# Patient Record
Sex: Male | Born: 1960 | Race: White | Hispanic: No | Smoking: Former smoker
Health system: Southern US, Community
[De-identification: ages and names within clinical notes are randomized; demographics above are authoritative.]

## PROBLEM LIST (undated history)

## (undated) DIAGNOSIS — F419 Anxiety disorder, unspecified: Secondary | ICD-10-CM

## (undated) DIAGNOSIS — F32A Depression, unspecified: Secondary | ICD-10-CM

## (undated) DIAGNOSIS — F329 Major depressive disorder, single episode, unspecified: Secondary | ICD-10-CM

## (undated) DIAGNOSIS — C159 Malignant neoplasm of esophagus, unspecified: Secondary | ICD-10-CM

## (undated) DIAGNOSIS — K219 Gastro-esophageal reflux disease without esophagitis: Secondary | ICD-10-CM

## (undated) DIAGNOSIS — F319 Bipolar disorder, unspecified: Secondary | ICD-10-CM

## (undated) HISTORY — DX: Malignant neoplasm of esophagus, unspecified: C15.9

## (undated) HISTORY — DX: Depression, unspecified: F32.A

## (undated) HISTORY — DX: Anxiety disorder, unspecified: F41.9

## (undated) HISTORY — DX: Major depressive disorder, single episode, unspecified: F32.9

---

## 2001-02-13 ENCOUNTER — Encounter: Payer: Self-pay | Admitting: *Deleted

## 2001-02-13 ENCOUNTER — Emergency Department (HOSPITAL_COMMUNITY): Admission: EM | Admit: 2001-02-13 | Discharge: 2001-02-13 | Payer: Self-pay | Admitting: *Deleted

## 2011-02-02 ENCOUNTER — Encounter: Payer: Self-pay | Admitting: Family Medicine

## 2011-02-02 DIAGNOSIS — F419 Anxiety disorder, unspecified: Secondary | ICD-10-CM | POA: Insufficient documentation

## 2011-12-18 ENCOUNTER — Emergency Department (HOSPITAL_COMMUNITY)
Admission: EM | Admit: 2011-12-18 | Discharge: 2011-12-18 | Disposition: A | Discharge status: Home / Self Care | Payer: Self-pay | Attending: Emergency Medicine | Admitting: Emergency Medicine

## 2011-12-18 ENCOUNTER — Encounter (HOSPITAL_COMMUNITY): Payer: Self-pay | Admitting: *Deleted

## 2011-12-18 DIAGNOSIS — W57XXXA Bitten or stung by nonvenomous insect and other nonvenomous arthropods, initial encounter: Secondary | ICD-10-CM | POA: Insufficient documentation

## 2011-12-18 DIAGNOSIS — S90569A Insect bite (nonvenomous), unspecified ankle, initial encounter: Secondary | ICD-10-CM | POA: Insufficient documentation

## 2011-12-18 DIAGNOSIS — T7840XA Allergy, unspecified, initial encounter: Secondary | ICD-10-CM | POA: Insufficient documentation

## 2011-12-18 DIAGNOSIS — Z87891 Personal history of nicotine dependence: Secondary | ICD-10-CM | POA: Insufficient documentation

## 2011-12-18 MED ORDER — DIPHENHYDRAMINE HCL 25 MG PO TABS: 25.0000 mg | ORAL_TABLET | Freq: Four times a day (QID) | ORAL | Status: DC

## 2011-12-18 MED ORDER — HYDROCORTISONE 1 % EX CREA: TOPICAL_CREAM | CUTANEOUS | Status: AC

## 2011-12-18 MED ORDER — SULFAMETHOXAZOLE-TRIMETHOPRIM 800-160 MG PO TABS: 1.0000 | ORAL_TABLET | Freq: Two times a day (BID) | ORAL | Status: AC

## 2011-12-18 NOTE — Discharge Instructions (Signed)
You may have a very early infection where the tick that you. Please take the medications exactly as prescribed including Benadryl 3 times a day, topical hydrocortisone cream to the skin, Bactrim twice a day for 10 days for possible infection. Return to the hospital for spreading redness, high fevers or worsening pain

## 2011-12-18 NOTE — ED Provider Notes (Signed)
History     CSN: [id number]  Arrival date & time 12/18/11  0041   First MD Initiated Contact with Patient 12/18/11 0100      Chief Complaint  Patient presents with  . Tick Removal    (Consider location/radiation/quality/duration/timing/severity/associated sxs/prior treatment) HPI Comments: Patient states that he pulled a tick off of his right thigh approximately 2 days ago. There was gradual onset of redness associated with itching. This is persistent, nothing makes it better or worse, he has tried topical calamine lotion with no improvement. He denies fevers chills nausea or vomiting. He states that he pulled the whole take off.  The history is provided by the patient.    Past Medical History  Diagnosis Date  . Anxiety   . Depression     History reviewed. No pertinent past surgical history.  History reviewed. No pertinent family history.  History  Substance Use Topics  . Smoking status: Former Games developer  . Smokeless tobacco: Not on file  . Alcohol Use: No      Review of Systems  Constitutional: Negative for fever and chills.  Gastrointestinal: Negative for nausea and vomiting.  Skin: Positive for rash.    Allergies  Review of patient's allergies indicates no known allergies.  Home Medications   Current Outpatient Rx  Name Route Sig Dispense Refill  . DIPHENHYDRAMINE HCL 25 MG PO TABS Oral Take 1 tablet (25 mg total) by mouth every 6 (six) hours. 20 tablet 0  . HYDROCORTISONE 1 % EX CREA  Apply to affected area 2 times daily 15 g 0  . PAROXETINE HCL 20 MG PO TABS Oral Take 20 mg by mouth every morning.      . SULFAMETHOXAZOLE-TRIMETHOPRIM 800-160 MG PO TABS Oral Take 1 tablet by mouth every 12 (twelve) hours. 20 tablet 0    BP 127/91  Pulse 59  Temp(Src) 98 F (36.7 C) (Oral)  Resp 18  Ht 6\' 5"  (1.956 m)  Wt 221 lb 4.8 oz (100.381 kg)  BMI 26.24 kg/m2  SpO2 98%  Physical Exam  Nursing note and vitals reviewed. Constitutional: He appears  well-developed and well-nourished. No distress.  HENT:  Head: Normocephalic and atraumatic.  Eyes: Conjunctivae are normal. No scleral icterus.  Cardiovascular: Normal rate, regular rhythm and normal heart sounds.   Pulmonary/Chest: Effort normal and breath sounds normal. No respiratory distress. He has no wheezes. He has no rales.  Musculoskeletal: Normal range of motion. He exhibits no edema and no tenderness.  Neurological: He is alert.       Normal gait, clear speech  Skin: Skin is warm and dry. Rash noted. There is erythema ( 4 cm area of warmth and erythema to the right waist line, central papule which is crusted over, no foreign bodies, minimal warmth, no pustules, vesicles, no induration, mild urticaria seen here in association with this area of redness).    ED Course  Procedures (including critical care time)  Labs Reviewed - No data to display No results found.   1. Tick bite   2. Allergic reaction       MDM  Well-appearing male, normal vital signs, no fever, local area of erythema surrounding a insect bite on the right waistline. Will give Benadryl, hydrocortisone cream, Bactrim as there does seem to be a hint of an early cellulitis. Followup instructions and return precautions given and voiced understanding expressed by patient.  Discharge Prescriptions include:  #1 Bactrim  #2 hydrocortisone cream  #3 Benadryl OTC  Vida Roller, MD 12/18/11 [phone]

## 2011-12-18 NOTE — ED Notes (Signed)
Pt c/o red area to right hip after pulling a tick off x 2 days ago; pt has no complaints at this time

## 2016-01-21 ENCOUNTER — Encounter: Payer: Self-pay | Admitting: Family Medicine

## 2016-01-22 ENCOUNTER — Encounter: Payer: Self-pay | Admitting: Physician Assistant

## 2016-01-22 ENCOUNTER — Ambulatory Visit (INDEPENDENT_AMBULATORY_CARE_PROVIDER_SITE_OTHER): Payer: Medicare Other | Admitting: Physician Assistant

## 2016-01-22 VITALS — BP 110/64 | HR 76 | Temp 98.7°F | Resp 16 | Ht 77.0 in | Wt 218.0 lb

## 2016-01-22 DIAGNOSIS — K219 Gastro-esophageal reflux disease without esophagitis: Secondary | ICD-10-CM

## 2016-01-22 MED ORDER — OMEPRAZOLE 20 MG PO CPDR: 20.0000 mg | DELAYED_RELEASE_CAPSULE | Freq: Every day | ORAL | Status: DC

## 2016-01-22 NOTE — Progress Notes (Signed)
Patient ID: SHADMAN TOZZI MRN: [id number], DOB: 07-23-1961, 55 y.o. Date of Encounter: 01/22/2016, 3:37 PM    Chief Complaint:  Chief Complaint  Patient presents with  . OTHER    GI issues - stomach pain after eating certain foods, states this is a new problem over the past 3 months. States he has vomited during the night a few times.       HPI: 55 y.o. year old white male recent as above.  He says that he used to come to this office and saw me about 9 or 10 years ago. Says that he used to come here regarding his anxiety but then he had a time where he did not have insurance and now he is going to [place] for that. Hasn't had any other medical problems since then and hasn't established with any different PCP.  Says that he is now on disability and is not working.  Presents with the above complaints/issues. Says that yesterday he ate part of a burger then his stomach started to hurt. Says that his stomach started to hurt after eating about one fourth of the burger but he did go ahead and eat about three fourths of it.  However says that he is able to eat some foods without any problem.  He drinks nutrition drinks and has no problem with this.  Chocolate seems to cause symptoms.  He has no nausea with this at all. Just discomfort in his stomach.  At night if he lies flat he feels discomfort but if he props up on pillows or in the recliner this relieves the symptoms.  Sometimes he is able to eat a full meal with no type of pain and has no nausea with it. It depends on what he eats. Other times he can eat and feels some abdominal pain.  He usually eats dinner around 8 PM and goes to bed around 10 PM.  No other complaints or concerns.  Has had no fevers or chills. No change in bowel patterns. Not having a lot of belching or burping and not having a lot of gas.     Home Meds:   Outpatient Prescriptions Prior to Visit  Medication Sig Dispense Refill  . PARoxetine  (PAXIL) 40 MG tablet Take 40 mg by mouth every morning.    Marland Kitchen QUEtiapine (SEROQUEL) 400 MG tablet Take 400 mg by mouth at bedtime.    . diphenhydrAMINE (BENADRYL) 25 MG tablet Take 1 tablet (25 mg total) by mouth every 6 (six) hours. 20 tablet 0   No facility-administered medications prior to visit.    Allergies: No Known Allergies    Review of Systems: See HPI for pertinent ROS. All other ROS negative.    Physical Exam: Blood pressure 110/64, pulse 76, temperature 98.7 F (37.1 C), temperature source Oral, resp. rate 16, height '6\' 5"'$  (1.956 m), weight 218 lb (98.884 kg)., Body mass index is 25.85 kg/(m^2). General:  WNWD WM. Appears in no acute distress. Neck: Supple. No thyromegaly. No lymphadenopathy. Lungs: Clear bilaterally to auscultation without wheezes, rales, or rhonchi. Breathing is unlabored. Heart: Regular rhythm. No murmurs, rubs, or gallops. Abdomen: Soft, non-tender, non-distended with normoactive bowel sounds. No hepatomegaly. No rebound/guarding. No obvious abdominal masses.There is NO tenderness with palpation--no tenderness with palpation of epigastric region and no tenderness with palpation of the right upper quadrant. Msk:  Strength and tone normal for age. Extremities/Skin: Warm and dry. Neuro: Alert and oriented X 3. Moves all extremities spontaneously. Gait  is normal. CNII-XII grossly in tact. Psych:  Responds to questions appropriately with a normal affect.     ASSESSMENT AND PLAN:  55 y.o. year old male with  1. Gastroesophageal reflux disease, esophagitis presence not specified - omeprazole (PRILOSEC) 20 MG capsule; Take 1 capsule (20 mg total) by mouth daily.  Dispense: 30 capsule; Refill: 3 Symptoms are consistent with GERD. If symptoms do not improve with this medication, or if symptoms change, then would consider labs, gallbladder ultrasound, possible referral to GI for endoscopy.  Discussed with patient whether he would be agreeable to return for  complete physical exam to do preventive care. He is interested and is agreeable and has scheduled this appointment today. Going to schedule this for early morning seeking come fasting to that appointment. If symptoms worsen or do not improve in 2 weeks, then make sure to follow-up regarding this. Otherwise will see him a complete physical exam.  Signed, Olean Ree [place], [place], [facility] 01/22/2016 3:37 PM

## 2016-03-15 ENCOUNTER — Encounter: Payer: Medicare Other | Admitting: Physician Assistant

## 2016-05-13 ENCOUNTER — Other Ambulatory Visit: Payer: Self-pay | Admitting: Physician Assistant

## 2016-05-13 DIAGNOSIS — K219 Gastro-esophageal reflux disease without esophagitis: Secondary | ICD-10-CM

## 2016-12-15 ENCOUNTER — Ambulatory Visit (INDEPENDENT_AMBULATORY_CARE_PROVIDER_SITE_OTHER): Payer: Self-pay | Admitting: Physician Assistant

## 2016-12-15 ENCOUNTER — Encounter: Payer: Self-pay | Admitting: Physician Assistant

## 2016-12-15 ENCOUNTER — Telehealth: Payer: Self-pay | Admitting: Physician Assistant

## 2016-12-15 VITALS — BP 118/80 | HR 118 | Temp 98.3°F | Resp 16 | Wt 173.8 lb

## 2016-12-15 DIAGNOSIS — K219 Gastro-esophageal reflux disease without esophagitis: Secondary | ICD-10-CM

## 2016-12-15 MED ORDER — PANTOPRAZOLE SODIUM 40 MG PO TBEC: 40.0000 mg | DELAYED_RELEASE_TABLET | Freq: Every day | ORAL | 5 refills | Status: DC

## 2016-12-15 MED ORDER — DEXLANSOPRAZOLE 60 MG PO CPDR: 60.0000 mg | DELAYED_RELEASE_CAPSULE | Freq: Every day | ORAL | 11 refills | Status: DC

## 2016-12-15 NOTE — Telephone Encounter (Signed)
Protonix 40mg  1 po QD # 30 + 5

## 2016-12-15 NOTE — Telephone Encounter (Signed)
Rx sent to pharmacy pt aware

## 2016-12-15 NOTE — Progress Notes (Signed)
    Patient ID: Kurt Fisher MRN: [id number], DOB: [birth year], 56 y.o. Date of Encounter: 12/15/2016, 12:28 PM    Chief Complaint:  Chief Complaint  Patient presents with  . Abdominal Pain     HPI: 56 y.o. year old male presents with above.   I reviewed his office note with me 01/22/2016. That visit I prescribed omeprazole for GERD.  Today he reports that he did take that medicine and it seemed to work for a while but then after about 3 months seemed like it quit working for him. Recently has been using Zantac. However says that he is having a lot of belching and burning discomfort in his chest. Has been worse the past 3 or 4 months. Says that certain foods seem to make the symptoms worse. He sees [place] for mental health and says that they just did a full panel of labs 1-2 weeks ago. He absolutely does not want to have any lab work done here     Home Meds:   Outpatient Medications Prior to Visit  Medication Sig Dispense Refill  . omeprazole (PRILOSEC) 20 MG capsule TAKE 1 CAPSULE (20 MG TOTAL) BY MOUTH DAILY. 30 capsule 3  . PARoxetine (PAXIL) 40 MG tablet Take 40 mg by mouth every morning.    Marland Kitchen QUEtiapine (SEROQUEL) 400 MG tablet Take 400 mg by mouth at bedtime.     No facility-administered medications prior to visit.     Allergies: No Known Allergies    Review of Systems: See HPI for pertinent ROS. All other ROS negative.    Physical Exam: Blood pressure 118/80, pulse (!) 118, temperature 98.3 F (36.8 C), temperature source Oral, resp. rate 16, weight 173 lb 12.8 oz (78.8 kg), SpO2 98 %., Body mass index is 20.61 kg/m. General:  WM. Appears in no acute distress. Neck: Supple. No thyromegaly. No lymphadenopathy. Lungs: Clear bilaterally to auscultation without wheezes, rales, or rhonchi. Breathing is unlabored. Heart: Regular rhythm. No murmurs, rubs, or gallops. Abdomen: Soft, non-tender, non-distended with normoactive bowel sounds. No hepatomegaly. No  rebound/guarding. No obvious abdominal masses. There is no area of tenderness with palpation of abdomen. Msk:  Strength and tone normal for age. Extremities/Skin: Warm and dry. Neuro: Alert and oriented X 3. Moves all extremities spontaneously. Gait is normal. CNII-XII grossly in tact. Psych:  Responds to questions appropriately with a normal affect.     ASSESSMENT AND PLAN:  56 y.o. year old male with  1. Gastroesophageal reflux disease, esophagitis presence not specified Will have him take Dexilant daily. Will have him schedule follow-up office visit with me in 2 weeks. If symptoms are not fully controlled with Dexilant will refer to GI for endoscopy. - dexlansoprazole (DEXILANT) 60 MG capsule; Take 1 capsule (60 mg total) by mouth daily.  Dispense: 30 capsule; Refill: [address] [place], [place], [facility] 12/15/2016 12:28 PM

## 2016-12-15 NOTE — Telephone Encounter (Signed)
Pls see note below. I spoke with pt he does not have Rx insurance I  spoke with pt and told him he could take  prevacid or nexium if cost is an issue   Pt asked if you could write him a prescription for protonix Pls advise

## 2016-12-15 NOTE — Telephone Encounter (Signed)
PATIENT IS CALLING TO SAY THAT THE DEXILANT IS TOO EXPENSIVE COULD SOMETHING ELSE BE [place]  [phone]

## 2016-12-21 ENCOUNTER — Telehealth: Payer: Self-pay

## 2016-12-21 NOTE — Telephone Encounter (Signed)
Pt called and states he is still not able to hold food down with the protonix, and is requesting carafate be prescribed. I explained to pt that carafate is to treat ulcers and asked if he had problems with ulcers pt stated no.   I explained to pt he would need a referral to GI doctor to find out why he is still not able to hold food down. Pt is concerned about the money with going to a GI doctor. Pt states he has been eating light     Pls advise

## 2016-12-21 NOTE — Telephone Encounter (Signed)
Pt called back and states he picked up pepogest a natural supplement peppermint oil  from the health store and started taking that for his stomach. Pt states so far he has been able to hold food down.   I explained to pt that the pepogest may only work temporaily and that if he stops taking it and see his symptoms began to start back up then he would need to be seen by a  GI specialist. Pt was informed to follow up if symptoms persist. Pt states he will also continue to take the protonix

## 2016-12-22 NOTE — Telephone Encounter (Signed)
Agree 

## 2016-12-26 ENCOUNTER — Emergency Department (HOSPITAL_COMMUNITY): Payer: Self-pay

## 2016-12-26 ENCOUNTER — Emergency Department (HOSPITAL_COMMUNITY)
Admission: EM | Admit: 2016-12-26 | Discharge: 2016-12-26 | Disposition: A | Discharge status: Home / Self Care | Payer: Self-pay | Attending: Emergency Medicine | Admitting: Emergency Medicine

## 2016-12-26 ENCOUNTER — Encounter (HOSPITAL_COMMUNITY): Payer: Self-pay | Admitting: Emergency Medicine

## 2016-12-26 DIAGNOSIS — R1033 Periumbilical pain: Secondary | ICD-10-CM

## 2016-12-26 DIAGNOSIS — D72829 Elevated white blood cell count, unspecified: Secondary | ICD-10-CM | POA: Insufficient documentation

## 2016-12-26 DIAGNOSIS — Z87891 Personal history of nicotine dependence: Secondary | ICD-10-CM | POA: Insufficient documentation

## 2016-12-26 DIAGNOSIS — N429 Disorder of prostate, unspecified: Secondary | ICD-10-CM | POA: Insufficient documentation

## 2016-12-26 DIAGNOSIS — N4289 Other specified disorders of prostate: Secondary | ICD-10-CM

## 2016-12-26 DIAGNOSIS — Z79899 Other long term (current) drug therapy: Secondary | ICD-10-CM | POA: Insufficient documentation

## 2016-12-26 HISTORY — DX: Gastro-esophageal reflux disease without esophagitis: K21.9

## 2016-12-26 LAB — CBC
HEMATOCRIT: 40.3 % (ref 39.0–52.0)
HEMOGLOBIN: 13.9 g/dL (ref 13.0–17.0)
MCH: 29.3 pg (ref 26.0–34.0)
MCHC: 34.5 g/dL (ref 30.0–36.0)
MCV: 84.8 fL (ref 78.0–100.0)
Platelets: 347 10*3/uL (ref 150–400)
RBC: 4.75 MIL/uL (ref 4.22–5.81)
RDW: 13 % (ref 11.5–15.5)
WBC: 12.1 10*3/uL — ABNORMAL HIGH (ref 4.0–10.5)

## 2016-12-26 LAB — COMPREHENSIVE METABOLIC PANEL
ALBUMIN: 3.8 g/dL (ref 3.5–5.0)
ALK PHOS: 90 U/L (ref 38–126)
ALT: 16 U/L — ABNORMAL LOW (ref 17–63)
ANION GAP: 12 (ref 5–15)
AST: 16 U/L (ref 15–41)
BUN: 24 mg/dL — ABNORMAL HIGH (ref 6–20)
CALCIUM: 9.7 mg/dL (ref 8.9–10.3)
CO2: 25 mmol/L (ref 22–32)
Chloride: 104 mmol/L (ref 101–111)
Creatinine, Ser: 1.15 mg/dL (ref 0.61–1.24)
GFR calc Af Amer: >60 mL/min (ref >60)
GFR calc non Af Amer: >60 mL/min (ref >60)
Glucose, Bld: 139 mg/dL — ABNORMAL HIGH (ref 65–99)
Potassium: 4.2 mmol/L (ref 3.5–5.1)
Sodium: 141 mmol/L (ref 135–145)
Total Bilirubin: 0.4 mg/dL (ref 0.3–1.2)
Total Protein: 7.6 g/dL (ref 6.5–8.1)

## 2016-12-26 LAB — URINALYSIS, ROUTINE W REFLEX MICROSCOPIC
BACTERIA UA: NONE SEEN
Bilirubin Urine: NEGATIVE
Glucose, UA: NEGATIVE mg/dL
Hgb urine dipstick: NEGATIVE
KETONES UR: 5 mg/dL — AB
Nitrite: NEGATIVE
PROTEIN: NEGATIVE mg/dL
Specific Gravity, Urine: 1.023 (ref 1.005–1.030)
pH: 6 (ref 5.0–8.0)

## 2016-12-26 LAB — LIPASE, BLOOD: Lipase: 19 U/L (ref 11–51)

## 2016-12-26 MED ORDER — SODIUM CHLORIDE 0.9 % IV BOLUS (SEPSIS)
1000.0000 mL | Freq: Once | INTRAVENOUS | Status: AC
  Administered 2016-12-26: 1000 mL via INTRAVENOUS

## 2016-12-26 MED ORDER — IOPAMIDOL (ISOVUE-300) INJECTION 61%
100.0000 mL | Freq: Once | INTRAVENOUS | Status: AC | PRN
  Administered 2016-12-26: 100 mL via INTRAVENOUS

## 2016-12-26 MED ORDER — SUCRALFATE 1 G PO TABS: 1.0000 g | ORAL_TABLET | Freq: Three times a day (TID) | ORAL | 0 refills | Status: DC

## 2016-12-26 NOTE — Discharge Instructions (Signed)
Your CT scan shows that you have a mass on your prostate that is pushing into your bladder This MUST be checked at your doctors office this week - call the Urologist - you should be seen in next 2 weeks.  ER for worsening symptoms.

## 2016-12-26 NOTE — ED Provider Notes (Signed)
[name]   CSN: [id number] Arrival date & time: 12/26/16  1338     History   Chief Complaint Chief Complaint  Patient presents with  . Abdominal Pain    HPI Kurt Fisher is a 56 y.o. male.  HPI  3-4 months of periumbilical abd pain - 9/24, daily, gradually worsening, protonix without help (saw PCP 2 weeks ago).  No imaging prior.  He has no prior abd surgery, nothing makes it better, 2 emesis in last few months - he has had sig weight loss in last year.  Yesterday was 12 hours of constant pain.  No fevers, diarrhea, chills, hematemesis, no BRBPR.  No CP and no SOB.  Past Medical History:  Diagnosis Date  . Anxiety   . Depression   . GERD (gastroesophageal reflux disease)     Patient Active Problem List   Diagnosis Date Noted  . GERD (gastroesophageal reflux disease) 01/22/2016  . Anxiety     History reviewed. No pertinent surgical history.     Home Medications    Prior to Admission medications   Medication Sig Start Date End Date Taking? Authorizing Provider  dexlansoprazole (DEXILANT) 60 MG capsule Take 1 capsule (60 mg total) by mouth daily. 12/15/16   [name], PA-C  omeprazole (PRILOSEC) 20 MG capsule TAKE 1 CAPSULE (20 MG TOTAL) BY MOUTH DAILY. 05/13/16   [name], PA-C  pantoprazole (PROTONIX) 40 MG tablet Take 1 tablet (40 mg total) by mouth daily. 12/15/16   [name], PA-C  PARoxetine (PAXIL) 40 MG tablet Take 40 mg by mouth every morning.    [provider]  QUEtiapine (SEROQUEL) 400 MG tablet Take 400 mg by mouth at bedtime.    [provider]    Family History History reviewed. No pertinent family history.  Social History Social History  Substance Use Topics  . Smoking status: Former Smoker    Types: Cigarettes  . Smokeless tobacco: Never Used  . Alcohol use No     Allergies   Patient has no known allergies.   Review of Systems Review of Systems  All other systems reviewed and are  negative.    Physical Exam Updated Vital Signs BP 130/84   Pulse 87   Temp 98.7 F (37.1 C) (Oral)   Resp 18   Ht 6\' 4"  (1.93 m)   Wt 78 kg (172 lb)   SpO2 100%   BMI 20.94 kg/m   Physical Exam  Constitutional: He appears well-developed and well-nourished. No distress.  HENT:  Head: Normocephalic and atraumatic.  Mouth/Throat: Oropharynx is clear and moist. No oropharyngeal exudate.  Eyes: Conjunctivae and EOM are normal. Pupils are equal, round, and reactive to light. Right eye exhibits no discharge. Left eye exhibits no discharge. No scleral icterus.  Neck: Normal range of motion. Neck supple. No JVD present. No thyromegaly present.  Cardiovascular: Regular rhythm, normal heart sounds and intact distal pulses.  Exam reveals no gallop and no friction rub.   No murmur heard. Mild tachycardia  Pulmonary/Chest: Effort normal and breath sounds normal. No respiratory distress. He has no wheezes. He has no rales.  Abdominal: Soft. Bowel sounds are normal. He exhibits no distension and no mass. There is no tenderness.  No tenderness  Musculoskeletal: Normal range of motion. He exhibits no edema or tenderness.  Lymphadenopathy:    He has no cervical adenopathy.  Neurological: He is alert. Coordination normal.  Skin: Skin is warm and dry. No rash  noted. No erythema.  Psychiatric: He has a normal mood and affect. His behavior is normal.  Nursing note and vitals reviewed.    ED Treatments / Results  Labs (all labs ordered are listed, but only abnormal results are displayed) Labs Reviewed  COMPREHENSIVE METABOLIC PANEL - Abnormal; Notable for the following:       Result Value   Glucose, Bld 139 (*)    BUN 24 (*)    ALT 16 (*)    All other components within normal limits  CBC - Abnormal; Notable for the following:    WBC 12.1 (*)    All other components within normal limits  LIPASE, BLOOD  URINALYSIS, ROUTINE W REFLEX MICROSCOPIC    Radiology Ct Abdomen Pelvis W  Contrast  Result Date: 12/26/2016 CLINICAL DATA:  Periumbilical pain for several months EXAM: CT ABDOMEN AND PELVIS WITH CONTRAST TECHNIQUE: Multidetector CT imaging of the abdomen and pelvis was performed using the standard protocol following bolus administration of intravenous contrast. CONTRAST:  153mL ISOVUE-300 IOPAMIDOL (ISOVUE-300) INJECTION 61% COMPARISON:  None. FINDINGS: Lower chest: Lung bases show no focal abnormality. A sliding-type hiatal hernia is seen. Hepatobiliary: No focal liver abnormality is seen. No gallstones, gallbladder wall thickening, or biliary dilatation. Pancreas: Unremarkable. No pancreatic ductal dilatation or surrounding inflammatory changes. Spleen: Normal in size without focal abnormality. Adrenals/Urinary Tract: Adrenal glands are unremarkable. Kidneys are normal, without renal calculi, focal lesion, or hydronephrosis. Bladder is well distended. A hyperdense lesion is noted arising from the prostate indenting upon the inferior aspect of the urinary bladder. This measures just over 2 cm in greatest dimension. Further evaluation is recommended. Stomach/Bowel: Stomach is within normal limits. Appendix appears normal. No evidence of bowel wall thickening, distention, or inflammatory changes. Vascular/Lymphatic: Aortic atherosclerosis. No enlarged abdominal or pelvic lymph nodes. Reproductive: Prostate is prominent with extension into the inferior aspect of the urinary bladder. Neoplasm cannot be excluded. Further workup is recommended. Other: No abdominal wall hernia or abnormality. No abdominopelvic ascites. Musculoskeletal: Degenerative changes of lumbar spine are seen. IMPRESSION: Masslike density extending into the bladder from the prostate. Focal neoplasm cannot be excluded. Further workup is recommended. Sliding-type hiatal hernia. No other focal abnormality is seen. Electronically Signed   By: Inez Catalina M.D.   On: 12/26/2016 17:10    Procedures Procedures (including  critical care time)  Medications Ordered in ED Medications  sodium chloride 0.9 % bolus 1,000 mL (0 mLs Intravenous Stopped 12/26/16 1720)  iopamidol (ISOVUE-300) 61 % injection 100 mL (100 mLs Intravenous Contrast Given 12/26/16 1653)     Initial Impression / Assessment and Plan / ED Course  I have reviewed the triage vital signs and the nursing notes.  Pertinent labs & imaging results that were available during my care of the patient were reviewed by me and considered in my medical decision making (see chart for details).     Concern for CA, other intraabdominal etiology Has unexpected weight loss, no reproducible pain Ongoing pain - some post prandial, endorses hx of PUD after nsaids so no longer uses No ETOH.  abd pain stable, non surgical abd Mild leukocytosis CT shows prostate mass Pt and spouse informed Uro f/u recommende d- they expressed their undersetanding No other obvious source of pt's pain Stable for d/c.  Final Clinical Impressions(s) / ED Diagnoses   Final diagnoses:  Prostate mass  Periumbilical abdominal pain  Leukocytosis, unspecified type    New Prescriptions New Prescriptions   No medications on file  Noemi Chapel, MD 12/26/16 [phone]

## 2016-12-26 NOTE — ED Triage Notes (Signed)
Patient c/o abd pain in umbilical region. Per patient pain x2-3 months but progressively getting worse and more frequent. Patient reports nausea and vomiting. Denies any diarrhea or fevers. Per patient unable to eat anything other than BRAT diet. Patient started Protonix 2 weeks ago with no relief. Patient being treated at [facility] by PCP but has been unable to find cause of pain. Patient states that he ate small amount yesterday and had abd pain x12 hours. Patient reports belching. Patient states last BM yesterday-normal, no blood noted. Patient has also states large amount of weight loss in past 2-3 months.

## 2016-12-26 NOTE — ED Notes (Signed)
Pt refusing to get on stretcher

## 2016-12-29 ENCOUNTER — Telehealth: Payer: Self-pay

## 2016-12-29 NOTE — Telephone Encounter (Signed)
FYI: Pt called to cancel his appt on 6-11 and wanted to make you aware that  He was seen at ED on 6/3 for abdominal pain pt states they discovered  A prostate mass, and hernia.   Pt was advised to f/u with PCP after his other appointments

## 2017-01-03 ENCOUNTER — Ambulatory Visit: Payer: Self-pay | Admitting: Physician Assistant

## 2017-01-12 ENCOUNTER — Telehealth: Payer: Self-pay

## 2017-01-12 NOTE — Telephone Encounter (Signed)
Spoke with Dr. Corbin Ade office they stated pt is schedule to see the NP on 6-25. Pt is not happy with that but understands that in order for him to get in sooner he will need to keep his appointment with the NP

## 2017-01-12 NOTE — Telephone Encounter (Signed)
FYI:  Pt had a referral for GI however the appt is not sch until Oct and pt is req we try and get him in sooner.  Will put in another referral for pt

## 2017-01-12 NOTE — Telephone Encounter (Signed)
Putting in another referral is not going to get him in to see them earlier. Forward this to Abbie in referrals and have her follow-up on seeing if she can get him in sooner.

## 2017-01-17 ENCOUNTER — Ambulatory Visit (INDEPENDENT_AMBULATORY_CARE_PROVIDER_SITE_OTHER): Payer: Self-pay | Admitting: Internal Medicine

## 2017-01-17 ENCOUNTER — Encounter (INDEPENDENT_AMBULATORY_CARE_PROVIDER_SITE_OTHER): Payer: Self-pay | Admitting: Internal Medicine

## 2017-01-17 VITALS — BP 118/80 | HR 80 | Temp 98.0°F | Ht 76.0 in | Wt 151.0 lb

## 2017-01-17 DIAGNOSIS — K219 Gastro-esophageal reflux disease without esophagitis: Secondary | ICD-10-CM

## 2017-01-17 DIAGNOSIS — R935 Abnormal findings on diagnostic imaging of other abdominal regions, including retroperitoneum: Secondary | ICD-10-CM

## 2017-01-17 NOTE — Patient Instructions (Signed)
Take the Protonix twice a day/ One 30 minutes before breakfast and one 30 minutes before breakfast.

## 2017-01-17 NOTE — Progress Notes (Addendum)
   Subjective:    Patient ID: Kurt Fisher, male    DOB: 19-Mar-1961, 56 y.o.   MRN: [id number] PCP: Dena Billet PA-C HPI Referred to our office by the ED for possible GERD. He says he is not able to eat much. He says he has a burning sensation. He has lost about 35 pounds in the past 6 months.  No NSAIDs. No dysphagia. Usually has a BM daily. Denies melena or BRRB.  He has some burning in his epigastric region.  Small amt of acid reflux.   No family hx of colon cancer.  Underwent a CT which revealed  IMPRESSION: Masslike density extending into the bladder from the prostate. Focal neoplasm cannot be excluded. Further workup is recommended.  Sliding-type hiatal hernia.  No other focal abnormality is seen.  CBC    Component Value Date/Time   WBC 12.1 (H) 12/26/2016 1532   RBC 4.75 12/26/2016 1532   HGB 13.9 12/26/2016 1532   HCT 40.3 12/26/2016 1532   PLT 347 12/26/2016 1532   MCV 84.8 12/26/2016 1532   MCH 29.3 12/26/2016 1532   MCHC 34.5 12/26/2016 1532   RDW 13.0 12/26/2016 1532   CMP     Component Value Date/Time   NA 141 12/26/2016 1532   K 4.2 12/26/2016 1532   CL 104 12/26/2016 1532   CO2 25 12/26/2016 1532   GLUCOSE 139 (H) 12/26/2016 1532   BUN 24 (H) 12/26/2016 1532   CREATININE 1.15 12/26/2016 1532   CALCIUM 9.7 12/26/2016 1532   PROT 7.6 12/26/2016 1532   ALBUMIN 3.8 12/26/2016 1532   AST 16 12/26/2016 1532   ALT 16 (L) 12/26/2016 1532   ALKPHOS 90 12/26/2016 1532   BILITOT 0.4 12/26/2016 1532   GFRNONAA >60 12/26/2016 1532   GFRAA >60 12/26/2016 1532   Lipase     Component Value Date/Time   LIPASE 19 12/26/2016 1532    Review of Systems Past Medical History:  Diagnosis Date  . Anxiety   . Depression   . GERD (gastroesophageal reflux disease)     No past surgical history on file.  No Known Allergies  Current Outpatient Prescriptions on File Prior to Visit  Medication Sig Dispense Refill  . PARoxetine (PAXIL) 40 MG tablet Take 40 mg  by mouth every morning.    Marland Kitchen QUEtiapine (SEROQUEL) 400 MG tablet Take 400 mg by mouth at bedtime.     No current facility-administered medications on file prior to visit.         Objective:   Physical Exam Blood pressure 118/80, pulse 80, temperature 98 F (36.7 C), height 6\' 4"  (1.93 m), weight 151 lb (68.5 kg). Alert and oriented. Skin warm and dry. Oral mucosa is moist.   . Sclera anicteric, conjunctivae is pink. Thyroid not enlarged. No cervical lymphadenopathy. Lungs clear. Heart regular rate and rhythm.  Abdomen is soft. Bowel sounds are positive. No hepatomegaly. No abdominal masses felt. No tenderness.  No edema to lower extremities.   Stool brown and guaiac positive.        Assessment & Plan:  Abnromal CT with mass to prostate. Am going to get a PSA GERD: Increase Protonix 40mg  BID.   Marland Kitchen  Weight loss :May need an EGD  Guaiac positive stool. Will need a colonoscopy also.  Further recommendations to follow.

## 2017-01-18 ENCOUNTER — Telehealth (INDEPENDENT_AMBULATORY_CARE_PROVIDER_SITE_OTHER): Payer: Self-pay | Admitting: *Deleted

## 2017-01-18 LAB — PSA: PSA: 0.9 ng/mL (ref <=4.0)

## 2017-01-18 NOTE — Telephone Encounter (Signed)
A person called and left a message that the patient 's Pantoprazole 40 mg had not been called to the pharmacy. I reviewed Deberah Castle, NP ov noted- she increased the dosage to twice a day . Take 1 30 minutes before breakfast and take the second one 30 minutes before supper. #60 5 refills  I talked with the patient and he ask that I call this to [place]. It was called to them and I spoke with April.

## 2017-01-19 ENCOUNTER — Telehealth (INDEPENDENT_AMBULATORY_CARE_PROVIDER_SITE_OTHER): Payer: Self-pay | Admitting: Internal Medicine

## 2017-01-19 MED ORDER — PANTOPRAZOLE SODIUM 40 MG PO TBEC: 40.0000 mg | DELAYED_RELEASE_TABLET | Freq: Two times a day (BID) | ORAL | 3 refills | Status: AC

## 2017-01-19 NOTE — Telephone Encounter (Signed)
Rx sent to his pharmacy

## 2017-01-25 ENCOUNTER — Telehealth (INDEPENDENT_AMBULATORY_CARE_PROVIDER_SITE_OTHER): Payer: Self-pay | Admitting: Internal Medicine

## 2017-01-25 NOTE — Telephone Encounter (Signed)
Will get his colonoscopy and EGD scheduled on Thursday 01/27/2017

## 2017-01-25 NOTE — Telephone Encounter (Signed)
Patient called, stated that he wants to have a colonoscopy and would like it as soon as possible.  [phone]

## 2017-01-27 ENCOUNTER — Telehealth (INDEPENDENT_AMBULATORY_CARE_PROVIDER_SITE_OTHER): Payer: Self-pay | Admitting: Internal Medicine

## 2017-01-27 ENCOUNTER — Encounter (INDEPENDENT_AMBULATORY_CARE_PROVIDER_SITE_OTHER): Payer: Self-pay | Admitting: *Deleted

## 2017-01-27 ENCOUNTER — Other Ambulatory Visit (INDEPENDENT_AMBULATORY_CARE_PROVIDER_SITE_OTHER): Payer: Self-pay | Admitting: Internal Medicine

## 2017-01-27 DIAGNOSIS — K219 Gastro-esophageal reflux disease without esophagitis: Secondary | ICD-10-CM

## 2017-01-27 DIAGNOSIS — R634 Abnormal weight loss: Secondary | ICD-10-CM | POA: Insufficient documentation

## 2017-01-27 DIAGNOSIS — R195 Other fecal abnormalities: Secondary | ICD-10-CM | POA: Insufficient documentation

## 2017-01-27 NOTE — Telephone Encounter (Signed)
I have put order in for colonoscopy/EGD

## 2017-01-27 NOTE — Telephone Encounter (Signed)
TCS/EGD sch'd 02/09/17, left message for patient to come by to pick up prep kit & instructions

## 2017-01-27 NOTE — Telephone Encounter (Signed)
Ann, EGD and colonoscopy as soon as possible.

## 2017-01-29 ENCOUNTER — Emergency Department (HOSPITAL_COMMUNITY): Payer: Self-pay

## 2017-01-29 ENCOUNTER — Emergency Department (HOSPITAL_COMMUNITY)
Admission: EM | Admit: 2017-01-29 | Discharge: 2017-01-29 | Disposition: A | Discharge status: Home / Self Care | Payer: Self-pay | Attending: Emergency Medicine | Admitting: Emergency Medicine

## 2017-01-29 ENCOUNTER — Encounter (HOSPITAL_COMMUNITY): Payer: Self-pay | Admitting: Emergency Medicine

## 2017-01-29 DIAGNOSIS — R55 Syncope and collapse: Secondary | ICD-10-CM | POA: Insufficient documentation

## 2017-01-29 DIAGNOSIS — R1084 Generalized abdominal pain: Secondary | ICD-10-CM | POA: Insufficient documentation

## 2017-01-29 DIAGNOSIS — Z79899 Other long term (current) drug therapy: Secondary | ICD-10-CM | POA: Insufficient documentation

## 2017-01-29 DIAGNOSIS — Z87891 Personal history of nicotine dependence: Secondary | ICD-10-CM | POA: Insufficient documentation

## 2017-01-29 HISTORY — DX: Bipolar disorder, unspecified: F31.9

## 2017-01-29 LAB — CBC WITH DIFFERENTIAL/PLATELET
Basophils Absolute: 0 10*3/uL (ref 0.0–0.1)
Basophils Relative: 0 %
EOS ABS: 0.1 10*3/uL (ref 0.0–0.7)
EOS PCT: 0 %
HCT: 30.3 % — ABNORMAL LOW (ref 39.0–52.0)
Hemoglobin: 10.2 g/dL — ABNORMAL LOW (ref 13.0–17.0)
LYMPHS ABS: 1 10*3/uL (ref 0.7–4.0)
Lymphocytes Relative: 7 %
MCH: 28.3 pg (ref 26.0–34.0)
MCHC: 33.7 g/dL (ref 30.0–36.0)
MCV: 84.2 fL (ref 78.0–100.0)
Monocytes Absolute: 1.1 10*3/uL — ABNORMAL HIGH (ref 0.1–1.0)
Monocytes Relative: 8 %
NEUTROS PCT: 85 %
Neutro Abs: 12 10*3/uL — ABNORMAL HIGH (ref 1.7–7.7)
PLATELETS: 363 10*3/uL (ref 150–400)
RBC: 3.6 MIL/uL — AB (ref 4.22–5.81)
RDW: 12.9 % (ref 11.5–15.5)
WBC: 14.2 10*3/uL — AB (ref 4.0–10.5)

## 2017-01-29 LAB — URINALYSIS, ROUTINE W REFLEX MICROSCOPIC
Bilirubin Urine: NEGATIVE
Glucose, UA: NEGATIVE mg/dL
Hgb urine dipstick: NEGATIVE
Ketones, ur: NEGATIVE mg/dL
LEUKOCYTES UA: NEGATIVE
Nitrite: NEGATIVE
Protein, ur: 100 mg/dL — AB
SPECIFIC GRAVITY, URINE: 1.02 (ref 1.005–1.030)
pH: 7.5 (ref 5.0–8.0)

## 2017-01-29 LAB — COMPREHENSIVE METABOLIC PANEL
ALT: 11 U/L — ABNORMAL LOW (ref 17–63)
ANION GAP: 7 (ref 5–15)
AST: 13 U/L — ABNORMAL LOW (ref 15–41)
Albumin: 3.1 g/dL — ABNORMAL LOW (ref 3.5–5.0)
Alkaline Phosphatase: 73 U/L (ref 38–126)
BUN: 19 mg/dL (ref 6–20)
CHLORIDE: 104 mmol/L (ref 101–111)
CO2: 28 mmol/L (ref 22–32)
Calcium: 8.4 mg/dL — ABNORMAL LOW (ref 8.9–10.3)
Creatinine, Ser: 1 mg/dL (ref 0.61–1.24)
GFR calc non Af Amer: >60 mL/min (ref >60)
GLUCOSE: 135 mg/dL — AB (ref 65–99)
Potassium: 4 mmol/L (ref 3.5–5.1)
SODIUM: 139 mmol/L (ref 135–145)
Total Bilirubin: 0.5 mg/dL (ref 0.3–1.2)
Total Protein: 6.4 g/dL — ABNORMAL LOW (ref 6.5–8.1)

## 2017-01-29 LAB — URINALYSIS, MICROSCOPIC (REFLEX)

## 2017-01-29 LAB — POC OCCULT BLOOD, ED: FECAL OCCULT BLD: NEGATIVE

## 2017-01-29 LAB — LIPASE, BLOOD: Lipase: 22 U/L (ref 11–51)

## 2017-01-29 MED ORDER — IOPAMIDOL (ISOVUE-300) INJECTION 61%
100.0000 mL | Freq: Once | INTRAVENOUS | Status: AC | PRN
  Administered 2017-01-29: 100 mL via INTRAVENOUS

## 2017-01-29 MED ORDER — SODIUM CHLORIDE 0.9 % IV BOLUS (SEPSIS)
1000.0000 mL | Freq: Once | INTRAVENOUS | Status: AC
  Administered 2017-01-29: 1000 mL via INTRAVENOUS

## 2017-01-29 NOTE — ED Triage Notes (Signed)
Has felt bad, lose of appetite and weight loss x past couple of months.  Today pt had episode of severe abd pain around umbilicus, felt extremely weak and "blacked out."  States someone lowered him to the ground he did not fall.  Pt is a&o x 4, denies any pain at this time.

## 2017-01-29 NOTE — ED Provider Notes (Signed)
[facility] DEPT Provider Note   CSN: [id number] Arrival date & time: 01/29/17  1944     History   Chief Complaint Chief Complaint  Patient presents with  . Weakness    HPI Kurt Fisher is a 56 y.o. male.  HPI 56 year old male who presents with syncope and abdominal pain. History of GERD, bipolar disorder. Reports several months of decreased appetite, early satiety, weight loss of over 50 lbs in over past 3 months. Associated with generalized abdominal discomfort after eating and intermittent nausea/vomiting, that improved after starting protonix. Had recent CT abd/pelvis in 12/2016 showing prostate mass into bladder. Referred to GI for evaluation and pending colonoscopy/endoscopy 02/09/2017. 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 States he became lightheaded and nauseous. A stranger saw him and came to assist him. He had brief LOC but was lowered to the ground. States that the pain is gradually improving. No fever chills, nausea, vomiting,d iarrhea, constipation, urinary complaints. Has not had similar abdominal pain in the past.   Past Medical History:  Diagnosis Date  . Anxiety   . Bipolar 1 disorder ([place])   . Depression   . GERD (gastroesophageal reflux disease)     Patient Active Problem List   Diagnosis Date Noted  . Loss of weight 01/27/2017  . Guaiac positive stools 01/27/2017  . Gastroesophageal reflux disease without esophagitis 01/27/2017  . GERD (gastroesophageal reflux disease) 01/22/2016  . Anxiety     History reviewed. No pertinent surgical history.     Home Medications    Prior to Admission medications   Medication Sig Start Date End Date Taking? Authorizing Provider  pantoprazole (PROTONIX) 40 MG tablet Take 1 tablet (40 mg total) by mouth 2 (two) times daily before a meal. 01/19/17  Yes Setzer, Terri L, NP  PARoxetine (PAXIL) 40 MG tablet Take 40 mg by mouth every morning.   Yes [provider]  QUEtiapine (SEROQUEL) 300 MG tablet Take 600 mg by mouth at bedtime.    Yes [provider]    Family History No family history on file.  Social History Social History  Substance Use Topics  . Smoking status: Former Smoker    Types: Cigarettes  . Smokeless tobacco: Never Used     Comment: quit 14 yrs ago  . Alcohol use No     Allergies   Patient has no known allergies.   Review of Systems Review of Systems  Constitutional: Negative for fever.  Respiratory: Negative for chest tightness and shortness of breath.   Cardiovascular: Negative for chest pain.  Gastrointestinal: Positive for abdominal pain.  Neurological: Positive for syncope.  All other systems reviewed and are negative.    Physical Exam Updated Vital Signs BP 136/89 (BP Location: Right Arm)   Pulse 100   Temp 98.2 F (36.8 C) (Oral)   Resp 18   Ht 6\' 4"  (1.93 m)   Wt 68.5 kg (151 lb)   SpO2 100%   BMI 18.38 kg/m   Physical Exam Physical Exam  Nursing note and vitals reviewed. Constitutional: Malnourished appearing, non-toxic, and in no acute distress Head: Normocephalic and atraumatic.  Mouth/Throat: Oropharynx is clear and moist.  Neck: Normal range of motion. Neck supple.  Cardiovascular: Normal rate and regular rhythm. No edema.  Pulmonary/Chest: Effort normal and breath sounds normal.  Abdominal: Soft. There is mild periumbilical tenderness. There is no rebound and no guarding.  Musculoskeletal: Normal range of motion.  Neurological:  Alert, no facial droop, fluent speech, moves all extremities symmetrically Skin: Skin is warm and dry.  Psychiatric: Cooperative   ED Treatments / Results  Labs (all labs ordered are listed, but only abnormal results are displayed) Labs Reviewed  CBC WITH DIFFERENTIAL/PLATELET - Abnormal; Notable for the following:       Result Value   WBC 14.2 (*)    RBC 3.60 (*)    Hemoglobin 10.2 (*)    HCT 30.3 (*)    Neutro Abs 12.0 (*)      Monocytes Absolute 1.1 (*)    All other components within normal limits  COMPREHENSIVE METABOLIC PANEL - Abnormal; Notable for the following:    Glucose, Bld 135 (*)    Calcium 8.4 (*)    Total Protein 6.4 (*)    Albumin 3.1 (*)    AST 13 (*)    ALT 11 (*)    All other components within normal limits  URINALYSIS, ROUTINE W REFLEX MICROSCOPIC - Abnormal; Notable for the following:    APPearance TURBID (*)    Protein, ur 100 (*)    All other components within normal limits  URINALYSIS, MICROSCOPIC (REFLEX) - Abnormal; Notable for the following:    Bacteria, UA FEW (*)    Squamous Epithelial / LPF 0-5 (*)    All other components within normal limits  LIPASE, BLOOD  POC OCCULT BLOOD, ED    EKG  EKG Interpretation  Date/Time:  Saturday January 29 2017 20:38:18 EDT Ventricular Rate:  89 PR Interval:    QRS Duration: 100 QT Interval:  363 QTC Calculation: 442 R Axis:   91 Text Interpretation:  Sinus rhythm Borderline right axis deviation no previous EKG  Reconfirmed by Brantley Stage [phone]) on 01/29/2017 8:58:02 PM       Radiology Ct Abdomen Pelvis W Contrast  Result Date: 01/29/2017 CLINICAL DATA:  Severe abdominal pain today leading to syncope. Known bladder/ prostate mass. Patient reports loss of appetite and weight loss for months. EXAM: CT ABDOMEN AND PELVIS WITH CONTRAST TECHNIQUE: Multidetector CT imaging of the abdomen and pelvis was performed using the standard protocol following bolus administration of intravenous contrast. CONTRAST:  173mL ISOVUE-300 IOPAMIDOL (ISOVUE-300) INJECTION 61% COMPARISON:  CT 1 month prior 12/26/2016 FINDINGS: Lower chest: No consolidation or pleural fluid. Lung bases are clear. Hepatobiliary: No focal hepatic lesion. Gallbladder physiologically distended, no calcified stone. No biliary dilatation. Pancreas: No ductal dilatation or inflammation. Spleen: Normal in size without focal abnormality. Adrenals/Urinary Tract: Normal adrenal glands.  No  hydronephrosis No perinephric edema. Homogeneous enhancement with symmetric excretion on delayed phase imaging. Urinary bladder mildly distended without wall thickening. Again seen prostatic nodularity extending to the bladder base. Stomach/Bowel: Moderate hiatal hernia with thickening of the distal esophagus. Appendix appears normal. No evidence of bowel wall thickening, distention, or inflammatory changes. Moderate stool burden. Vascular/Lymphatic: Minimally aortic atherosclerosis. No aneurysm. No abdominal or pelvic adenopathy. Reproductive: Hyperdense nodule extends from the midline prostate gland into the bladder base. Other: No abdominal wall hernia or abnormality. No abdominopelvic ascites. Faint stranding of the anterior omental fat is similar to prior exam. Musculoskeletal: There are no acute or suspicious osseous abnormalities. IMPRESSION: 1. No acute abnormality or change from prior exam. 2. Heterogeneous nodular prostate gland with hyperdense nodularity extending into the bladder base. 3. Moderate hiatal hernia with distal esophageal wall thickening. Wall thickening likely secondary to esophagitis/reflux, cannot exclude esophageal malignancy in the setting of decreased appetite and weight loss. Consider endoscopy. Electronically Signed   By:  Jeb Levering M.D.   On: 01/29/2017 22:20    Procedures Procedures (including critical care time)  Medications Ordered in ED Medications  sodium chloride 0.9 % bolus 1,000 mL (0 mLs Intravenous Stopped 01/29/17 2139)  iopamidol (ISOVUE-300) 61 % injection 100 mL (100 mLs Intravenous Contrast Given 01/29/17 2200)     Initial Impression / Assessment and Plan / ED Course  I have reviewed the triage vital signs and the nursing notes.  Pertinent labs & imaging results that were available during my care of the patient were reviewed by me and considered in my medical decision making (see chart for details).     P/w syncope and abdominal pain. Abdominal  pain improving without intervention in the ED. Soft and non-surgical abdomen. Syncope felt to be reflex syncope from his pain. EKG w/o stigmata of arrhythmia.   Records reviewed. Has scheduled EGDs by Dr. Laural Golden on 02/09/17. History is concerning for underlying malignancy. Previous CT showing prostate mass, but recent PSA was normal.   Blood work with new anemia 10, but no active rectal bleeding. Occult blood positive in GI clinic. Repeat CT obtained in ED today given history of severe abdominal pain with syncope. CT visualized, shows a known hiatal hernia and prostate mass. No acute intra-abdominal findings otherwise. Blood work otherwise reassuring. UA without infection. He is felt to be stable for discharge home with continued outpatient workup with GI.  Strict return and follow-up instructions reviewed. He expressed understanding of all discharge instructions and felt comfortable with the plan of care.   Final Clinical Impressions(s) / ED Diagnoses   Final diagnoses:  Syncope and collapse  Generalized abdominal pain    New Prescriptions New Prescriptions   No medications on file     Forde Dandy, MD 01/29/17 2231

## 2017-01-29 NOTE — Discharge Instructions (Signed)
Your CT still shows the mass in the prostate into the latter. You still have a hiatal hernia. There is no other new concerning findings on your CT scan. Please continue to follow up with GI as scheduled for your colonoscopy and endoscopy. Drink plenty of fluids and keep her hydrated.  Return without fail for worsening symptoms, including fever, intractable vomiting, escalating pain, or any other symptoms concerning to you.

## 2017-02-09 ENCOUNTER — Ambulatory Visit (HOSPITAL_COMMUNITY)
Admission: RE | Admit: 2017-02-09 | Discharge: 2017-02-09 | Disposition: A | Discharge status: Home / Self Care | Payer: Self-pay | Source: Ambulatory Visit | Attending: Internal Medicine | Admitting: Internal Medicine

## 2017-02-09 ENCOUNTER — Encounter (HOSPITAL_COMMUNITY)
Admission: RE | Disposition: A | Discharge status: Home / Self Care | Payer: Self-pay | Source: Ambulatory Visit | Attending: Internal Medicine

## 2017-02-09 ENCOUNTER — Encounter (HOSPITAL_COMMUNITY): Payer: Self-pay | Admitting: *Deleted

## 2017-02-09 DIAGNOSIS — Z79899 Other long term (current) drug therapy: Secondary | ICD-10-CM | POA: Insufficient documentation

## 2017-02-09 DIAGNOSIS — F319 Bipolar disorder, unspecified: Secondary | ICD-10-CM | POA: Insufficient documentation

## 2017-02-09 DIAGNOSIS — R195 Other fecal abnormalities: Secondary | ICD-10-CM | POA: Insufficient documentation

## 2017-02-09 DIAGNOSIS — Z87891 Personal history of nicotine dependence: Secondary | ICD-10-CM | POA: Insufficient documentation

## 2017-02-09 DIAGNOSIS — K449 Diaphragmatic hernia without obstruction or gangrene: Secondary | ICD-10-CM | POA: Insufficient documentation

## 2017-02-09 DIAGNOSIS — K219 Gastro-esophageal reflux disease without esophagitis: Secondary | ICD-10-CM | POA: Insufficient documentation

## 2017-02-09 DIAGNOSIS — R112 Nausea with vomiting, unspecified: Secondary | ICD-10-CM | POA: Insufficient documentation

## 2017-02-09 DIAGNOSIS — C160 Malignant neoplasm of cardia: Secondary | ICD-10-CM | POA: Insufficient documentation

## 2017-02-09 DIAGNOSIS — K644 Residual hemorrhoidal skin tags: Secondary | ICD-10-CM | POA: Diagnosis not present

## 2017-02-09 DIAGNOSIS — R634 Abnormal weight loss: Secondary | ICD-10-CM | POA: Insufficient documentation

## 2017-02-09 DIAGNOSIS — K222 Esophageal obstruction: Secondary | ICD-10-CM | POA: Insufficient documentation

## 2017-02-09 DIAGNOSIS — C159 Malignant neoplasm of esophagus, unspecified: Secondary | ICD-10-CM | POA: Diagnosis not present

## 2017-02-09 DIAGNOSIS — D649 Anemia, unspecified: Secondary | ICD-10-CM | POA: Insufficient documentation

## 2017-02-09 DIAGNOSIS — F419 Anxiety disorder, unspecified: Secondary | ICD-10-CM | POA: Insufficient documentation

## 2017-02-09 HISTORY — PX: COLONOSCOPY: SHX5424

## 2017-02-09 HISTORY — PX: ESOPHAGOGASTRODUODENOSCOPY: SHX5428

## 2017-02-09 HISTORY — PX: BIOPSY: SHX5522

## 2017-02-09 SURGERY — EGD (ESOPHAGOGASTRODUODENOSCOPY)
Anesthesia: Moderate Sedation

## 2017-02-09 MED ORDER — MEPERIDINE HCL 50 MG/ML IJ SOLN
INTRAMUSCULAR | Status: DC | PRN
  Administered 2017-02-09 (×2): 25 mg via INTRAVENOUS

## 2017-02-09 MED ORDER — MIDAZOLAM HCL 5 MG/5ML IJ SOLN
INTRAMUSCULAR | Status: DC | PRN
  Administered 2017-02-09 (×5): 2 mg via INTRAVENOUS

## 2017-02-09 MED ORDER — SODIUM CHLORIDE 0.9 % IV SOLN
INTRAVENOUS | Status: DC
Start: 2017-02-09 — End: 2017-02-09
  Administered 2017-02-09: 13:00:00 via INTRAVENOUS

## 2017-02-09 MED ORDER — LIDOCAINE VISCOUS 2 % MT SOLN
OROMUCOSAL | Status: DC | PRN
  Administered 2017-02-09: 4 mL via OROMUCOSAL

## 2017-02-09 MED ORDER — STERILE WATER FOR IRRIGATION IR SOLN
Status: DC | PRN
  Administered 2017-02-09: 13:00:00

## 2017-02-09 MED ORDER — LIDOCAINE VISCOUS 2 % MT SOLN
OROMUCOSAL | Status: AC
  Filled 2017-02-09: qty 15

## 2017-02-09 MED ORDER — MIDAZOLAM HCL 5 MG/5ML IJ SOLN
INTRAMUSCULAR | Status: AC
  Filled 2017-02-09: qty 10

## 2017-02-09 MED ORDER — MEPERIDINE HCL 50 MG/ML IJ SOLN
INTRAMUSCULAR | Status: AC
  Filled 2017-02-09: qty 1

## 2017-02-09 NOTE — Op Note (Signed)
[facility] Patient Name: Kurt Fisher Procedure Date: 02/09/2017 1:19 PM MRN: [id number] Date of Birth: 1961-03-28 Attending MD: Hildred Laser , MD CSN: [id number] Age: 56 Admit Type: Outpatient Procedure:                Colonoscopy Indications:              Heme positive stool and anemia. Providers:                Hildred Laser, MD, Jeanann Lewandowsky. Sharon Seller, RN, Aram Candela Referring MD:             Lonie Peak. Dixon, PAC Medicines:                Midazolam 1 mg IV Complications:            No immediate complications. Estimated Blood Loss:     Estimated blood loss: none. Procedure:                Pre-Anesthesia Assessment:                           - Prior to the procedure, a History and Physical                            was performed, and patient medications and                            allergies were reviewed. The patient's tolerance of                            previous anesthesia was also reviewed. The risks                            and benefits of the procedure and the sedation                            options and risks were discussed with the patient.                            All questions were answered, and informed consent                            was obtained. Prior Anticoagulants: The patient has                            taken no previous anticoagulant or antiplatelet                            agents. ASA Grade Assessment: II - A patient with                            mild systemic disease. After reviewing the risks  and benefits, the patient was deemed in                            satisfactory condition to undergo the procedure.                           After obtaining informed consent, the colonoscope                            was passed under direct vision. Throughout the                            procedure, the patient's blood pressure, pulse, and                            oxygen saturations were  monitored continuously. The                            EC-[id number] ([id number]) scope was introduced through                            the anus and advanced to the the cecum, identified                            by its appearance. The colonoscopy was somewhat                            difficult due to inadequate bowel prep. The patient                            tolerated the procedure well. The quality of the                            bowel preparation was inadequate. The entire colon                            was not examined. Specifically, the cecum was not                            adequately visualized. The ileocecal valve and the                            rectum were photographed. Scope In: 1:21:05 PM Scope Out: 1:36:04 PM Scope Withdrawal Time: 0 hours 1 minute 32 seconds  Total Procedure Duration: 0 hours 14 minutes 59 seconds  Findings:      The perianal and digital rectal examinations were normal.      The rectum, recto-sigmoid colon, sigmoid colon, descending colon,       splenic flexure, transverse colon and hepatic flexure appeared normal.       Examination of ascending colon was limited.      External hemorrhoids were found during retroflexion. The hemorrhoids       were medium-sized. Impression:               - Preparation  of the colon was inadequate.                           - The rectum, recto-sigmoid colon, sigmoid colon,                            descending colon, splenic flexure, transverse colon                            and hepatic flexure are normal.                           - Examination of ascending colon was very limited                            because of presence of larormed stool.                           - External hemorrhoids.                           - No specimens collected. Moderate Sedation:      Moderate (conscious) sedation was administered by the endoscopy nurse       and supervised by the endoscopist. The following parameters were        monitored: oxygen saturation, heart rate, blood pressure, CO2       capnography and response to care. Total physician intraservice time was       18 minutes. Recommendation:           - Patient has a contact number available for                            emergencies. The signs and symptoms of potential                            delayed complications were discussed with the                            patient. Return to normal activities tomorrow.                            Written discharge instructions were provided to the                            patient.                           - Mechanical soft diet today.                           - Continue present medications.                           - Await pathology results.                           - No recommendation  at this time regarding repeat                            colonoscopy. Procedure Code(s):        --- Professional ---                           [phone], Colonoscopy, flexible; diagnostic, including                            collection of specimen(s) by brushing or washing,                            when performed (separate procedure)                           99152, Moderate sedation services provided by the                            same physician or other qualified health care                            professional performing the diagnostic or                            therapeutic service that the sedation supports,                            requiring the presence of an independent trained                            observer to assist in the monitoring of the                            patient's level of consciousness and physiological                            status; initial 15 minutes of intraservice time,                            patient age 71 years or older Diagnosis Code(s):        --- Professional ---                           K64.4, Residual hemorrhoidal skin tags                           R19.5, Other  fecal abnormalities CPT copyright 2016 American Medical [employer]. All rights reserved. The codes documented in this report are preliminary and upon coder review may  be revised to meet current compliance requirements. Hildred Laser, MD Hildred Laser, MD 02/09/2017 2:37:50 PM This report has been signed electronically. Number of Addenda: 0

## 2017-02-09 NOTE — H&P (Signed)
Kurt Fisher is an 56 y.o. male.   Chief Complaint: Patient is here for EGD and colonoscopy. HPI: Patient is 56 year old Caucasian male who presents with 4 month history of intermittent left upper quadrant pain early satiety nausea and sporadic vomiting. He has lost close to 35 pounds. He states he has not vomited the last 3 weeks since pantoprazole dose was doubled. He denies hematemesis or melena. On workup he was also found to have anemia and her stool was heme positive. He has occasional hematochezia in the form of blood in the tissue. He believes is an as hemorrhoids. A few weeks ago he had severe midabdominal pain and past abdomen was seen in emergency room and CT was unremarkable other than prostatic abnormality. He does not take OTC NSAIDs. He does not smoke cigarettes or drink alcohol. He has never been screened for CRC.  Past Medical History:  Diagnosis Date  . Anxiety   . Bipolar 1 disorder ([place])   . Depression   . GERD (gastroesophageal reflux disease)     History reviewed. No pertinent surgical history.  History reviewed. No pertinent family history. Social History:  reports that he has quit smoking. His smoking use included Cigarettes. He has never used smokeless tobacco. He reports that he does not drink alcohol or use drugs.  Allergies: No Known Allergies  Medications Prior to Admission  Medication Sig Dispense Refill  . pantoprazole (PROTONIX) 40 MG tablet Take 1 tablet (40 mg total) by mouth 2 (two) times daily before a meal. 60 tablet 3  . PARoxetine (PAXIL) 40 MG tablet Take 40 mg by mouth every morning.    Marland Kitchen QUEtiapine (SEROQUEL) 300 MG tablet Take 600 mg by mouth at bedtime.       No results found for this or any previous visit (from the past 48 hour(s)). No results found.  ROS  Blood pressure 123/71, pulse (!) 111, temperature 98.9 F (37.2 C), temperature source Oral, resp. rate 15, height 6\' 4"  (1.93 m), weight 151 lb (68.5 kg). Physical Exam   Constitutional:  Well-developed thin Caucasian male in NAD.  HENT:  Mouth/Throat: Oropharynx is clear and moist.  Eyes: Conjunctivae are normal.  Neck: No thyromegaly present.  Cardiovascular: Normal rate, regular rhythm and normal heart sounds.   No murmur heard. Respiratory: Effort normal and breath sounds normal.  GI: Soft. He exhibits no distension and no mass. There is no tenderness.  Musculoskeletal: He exhibits no edema.  Neurological: He is alert.  Skin: Skin is warm and dry.     Assessment/Plan Nausea vomiting and LUQ abdominal pain. Weight loss. Heme positive stool and anemia. Diagnostic EGD and colonoscopy.  Hildred Laser, MD 02/09/2017, 12:55 PM

## 2017-02-09 NOTE — Discharge Instructions (Signed)
Resume usual medications as before. Mechanical soft diet as discussed. No driving for 24 hours. Please return to day hospital on 02/11/2017 to discuss results of biopsy.

## 2017-02-09 NOTE — Op Note (Signed)
[facility] Patient Name: Kurt Fisher Procedure Date: 02/09/2017 11:20 AM MRN: [id number] Date of Birth: [birth year] Attending MD: Hildred Laser , MD CSN: [id number] Age: 56 Admit Type: Outpatient Procedure:                Upper GI endoscopy Indications:              Nausea with vomiting, Weight loss Providers:                Hildred Laser, MD, Otis Peak B. Sharon Seller, RN, Aram Candela Referring MD:             Lonie Peak. Dixon,PAC Medicines:                Lidocaine spray, Meperidine 50 mg IV, Midazolam 9                            mg IV Complications:            No immediate complications. Estimated Blood Loss:     Estimated blood loss was minimal. Procedure:                Pre-Anesthesia Assessment:                           - Prior to the procedure, a History and Physical                            was performed, and patient medications and                            allergies were reviewed. The patient's tolerance of                            previous anesthesia was also reviewed. The risks                            and benefits of the procedure and the sedation                            options and risks were discussed with the patient.                            All questions were answered, and informed consent                            was obtained. Prior Anticoagulants: The patient has                            taken no previous anticoagulant or antiplatelet                            agents. ASA Grade Assessment: II - A patient with  mild systemic disease. After reviewing the risks                            and benefits, the patient was deemed in                            satisfactory condition to undergo the procedure.                           After obtaining informed consent, the endoscope was                            passed under direct vision. Throughout the                            procedure, the patient's  blood pressure, pulse, and                            oxygen saturations were monitored continuously. The                            [id number]-[id number] ([id number]) scope was introduced through the                            mouth, and advanced to the second part of duodenum.                            The upper GI endoscopy was accomplished without                            difficulty. The patient tolerated the procedure                            well. Scope In: 1:07:46 PM Scope Out: 1:18:14 PM Total Procedure Duration: 0 hours 10 minutes 28 seconds  Findings:      The proximal esophagus and mid esophagus were normal.      One moderate (circumferential scarring or stenosis; an endoscope may       pass) malignant-appearing, intrinsic stenosis was found 37 to 41 cm from       the incisors. This measured 8 mm (inner diameter) x 4 cm (in length) and       was traversed.at the distal and it was polypoidal or masslike. Biopsies       were taken with a cold forceps for histology. The pathology specimen was       placed into Bottle Number 1.      The Z-line was irregular and was found 41 cm from the incisors.      A 2 cm hiatal hernia was present.      A small amount of food (residue) was found in the gastric body.      The exam of the stomach was otherwise normal.      The duodenal bulb and second portion of the duodenum were normal. Impression:               - Normal proximal esophagus and mid esophagus.                           -  4 cm long distal esophageal stricture secondary                            to infiltrating lesion felt to be malignant with                            polypoidal complement at GE junction. Biopsied.                           - Z-line irregular, 41 cm from the incisors.                           - 2 cm hiatal hernia.                           - A small amount of food (residue) in the stomach.                           - Normal duodenal bulb and second portion of the                             duodenum. Moderate Sedation:      Moderate (conscious) sedation was administered by the endoscopy nurse       and supervised by the endoscopist. The following parameters were       monitored: oxygen saturation, heart rate, blood pressure, CO2       capnography and response to care. Total physician intraservice time was       17 minutes. Recommendation:           - Patient has a contact number available for                            emergencies. The signs and symptoms of potential                            delayed complications were discussed with the                            patient. Return to normal activities tomorrow.                            Written discharge instructions were provided to the                            patient.                           - Mechanical soft diet today.                           - Continue present medications.                           - Await pathology results.                           -  See the other procedure note for documentation of                            additional recommendations. Procedure Code(s):        --- Professional ---                           [phone], Esophagogastroduodenoscopy, flexible,                            transoral; with biopsy, single or multiple                           99152, Moderate sedation services provided by the                            same physician or other qualified health care                            professional performing the diagnostic or                            therapeutic service that the sedation supports,                            requiring the presence of an independent trained                            observer to assist in the monitoring of the                            patient's level of consciousness and physiological                            status; initial 15 minutes of intraservice time,                            patient age 48 years or older Diagnosis  Code(s):        --- Professional ---                           K22.2, Esophageal obstruction                           K22.8, Other specified diseases of esophagus                           K44.9, Diaphragmatic hernia without obstruction or                            gangrene                           R11.2, Nausea with vomiting, unspecified  R63.4, Abnormal weight loss CPT copyright 2016 American Medical Association. All rights reserved. The codes documented in this report are preliminary and upon coder review may  be revised to meet current compliance requirements. Hildred Laser, MD Hildred Laser, MD 02/09/2017 2:31:33 PM This report has been signed electronically. Number of Addenda: 0

## 2017-02-11 ENCOUNTER — Other Ambulatory Visit (INDEPENDENT_AMBULATORY_CARE_PROVIDER_SITE_OTHER): Payer: Self-pay | Admitting: Internal Medicine

## 2017-02-11 ENCOUNTER — Encounter (HOSPITAL_COMMUNITY): Payer: Self-pay | Admitting: Internal Medicine

## 2017-02-11 DIAGNOSIS — C159 Malignant neoplasm of esophagus, unspecified: Secondary | ICD-10-CM

## 2017-02-14 ENCOUNTER — Ambulatory Visit (HOSPITAL_COMMUNITY)
Admission: RE | Admit: 2017-02-14 | Discharge: 2017-02-14 | Disposition: A | Discharge status: Home / Self Care | Payer: Self-pay | Source: Ambulatory Visit | Attending: Internal Medicine | Admitting: Internal Medicine

## 2017-02-14 DIAGNOSIS — C159 Malignant neoplasm of esophagus, unspecified: Secondary | ICD-10-CM | POA: Insufficient documentation

## 2017-02-14 DIAGNOSIS — R590 Localized enlarged lymph nodes: Secondary | ICD-10-CM | POA: Insufficient documentation

## 2017-02-14 DIAGNOSIS — C771 Secondary and unspecified malignant neoplasm of intrathoracic lymph nodes: Secondary | ICD-10-CM | POA: Insufficient documentation

## 2017-02-14 DIAGNOSIS — K449 Diaphragmatic hernia without obstruction or gangrene: Secondary | ICD-10-CM | POA: Insufficient documentation

## 2017-02-14 MED ORDER — IOPAMIDOL (ISOVUE-300) INJECTION 61%
75.0000 mL | Freq: Once | INTRAVENOUS | Status: AC | PRN
  Administered 2017-02-14: 75 mL via INTRAVENOUS

## 2017-02-15 ENCOUNTER — Telehealth (INDEPENDENT_AMBULATORY_CARE_PROVIDER_SITE_OTHER): Payer: Self-pay | Admitting: Internal Medicine

## 2017-02-15 NOTE — Telephone Encounter (Signed)
Patient's spouse called, very anxious to get the results of his CT that was done yesterday.  She stated that they just found out he has cancer.  [phone]

## 2017-02-15 NOTE — Telephone Encounter (Signed)
Dr.Rehman was made awar and he is calling the patient.

## 2017-02-16 ENCOUNTER — Encounter (HOSPITAL_COMMUNITY): Payer: Self-pay | Attending: Oncology | Admitting: Oncology

## 2017-02-16 ENCOUNTER — Encounter (HOSPITAL_COMMUNITY): Payer: Self-pay | Admitting: Oncology

## 2017-02-16 ENCOUNTER — Encounter (HOSPITAL_COMMUNITY): Payer: No Typology Code available for payment source

## 2017-02-16 VITALS — BP 109/69 | HR 127 | Resp 16 | Ht 76.0 in | Wt 151.0 lb

## 2017-02-16 DIAGNOSIS — D649 Anemia, unspecified: Secondary | ICD-10-CM | POA: Insufficient documentation

## 2017-02-16 DIAGNOSIS — K449 Diaphragmatic hernia without obstruction or gangrene: Secondary | ICD-10-CM | POA: Insufficient documentation

## 2017-02-16 DIAGNOSIS — F419 Anxiety disorder, unspecified: Secondary | ICD-10-CM | POA: Insufficient documentation

## 2017-02-16 DIAGNOSIS — Z8249 Family history of ischemic heart disease and other diseases of the circulatory system: Secondary | ICD-10-CM | POA: Insufficient documentation

## 2017-02-16 DIAGNOSIS — F17210 Nicotine dependence, cigarettes, uncomplicated: Secondary | ICD-10-CM | POA: Insufficient documentation

## 2017-02-16 DIAGNOSIS — C155 Malignant neoplasm of lower third of esophagus: Secondary | ICD-10-CM | POA: Insufficient documentation

## 2017-02-16 DIAGNOSIS — R590 Localized enlarged lymph nodes: Secondary | ICD-10-CM | POA: Insufficient documentation

## 2017-02-16 DIAGNOSIS — K219 Gastro-esophageal reflux disease without esophagitis: Secondary | ICD-10-CM | POA: Insufficient documentation

## 2017-02-16 DIAGNOSIS — Z8501 Personal history of malignant neoplasm of esophagus: Secondary | ICD-10-CM | POA: Insufficient documentation

## 2017-02-16 DIAGNOSIS — C771 Secondary and unspecified malignant neoplasm of intrathoracic lymph nodes: Secondary | ICD-10-CM | POA: Insufficient documentation

## 2017-02-16 DIAGNOSIS — R45851 Suicidal ideations: Secondary | ICD-10-CM | POA: Insufficient documentation

## 2017-02-16 DIAGNOSIS — Z79899 Other long term (current) drug therapy: Secondary | ICD-10-CM | POA: Insufficient documentation

## 2017-02-16 DIAGNOSIS — C7889 Secondary malignant neoplasm of other digestive organs: Secondary | ICD-10-CM | POA: Insufficient documentation

## 2017-02-16 DIAGNOSIS — C159 Malignant neoplasm of esophagus, unspecified: Secondary | ICD-10-CM

## 2017-02-16 DIAGNOSIS — Z818 Family history of other mental and behavioral disorders: Secondary | ICD-10-CM | POA: Insufficient documentation

## 2017-02-16 DIAGNOSIS — C781 Secondary malignant neoplasm of mediastinum: Secondary | ICD-10-CM | POA: Insufficient documentation

## 2017-02-16 DIAGNOSIS — F319 Bipolar disorder, unspecified: Secondary | ICD-10-CM | POA: Insufficient documentation

## 2017-02-16 DIAGNOSIS — F329 Major depressive disorder, single episode, unspecified: Secondary | ICD-10-CM

## 2017-02-16 DIAGNOSIS — F17211 Nicotine dependence, cigarettes, in remission: Secondary | ICD-10-CM

## 2017-02-16 DIAGNOSIS — C3402 Malignant neoplasm of left main bronchus: Secondary | ICD-10-CM | POA: Insufficient documentation

## 2017-02-16 DIAGNOSIS — Z801 Family history of malignant neoplasm of trachea, bronchus and lung: Secondary | ICD-10-CM | POA: Insufficient documentation

## 2017-02-16 DIAGNOSIS — Z9889 Other specified postprocedural states: Secondary | ICD-10-CM | POA: Insufficient documentation

## 2017-02-16 DIAGNOSIS — Z833 Family history of diabetes mellitus: Secondary | ICD-10-CM | POA: Insufficient documentation

## 2017-02-16 DIAGNOSIS — Z825 Family history of asthma and other chronic lower respiratory diseases: Secondary | ICD-10-CM | POA: Insufficient documentation

## 2017-02-16 HISTORY — DX: Malignant neoplasm of esophagus, unspecified: C15.9

## 2017-02-16 LAB — COMPREHENSIVE METABOLIC PANEL
ALT: 9 U/L — AB (ref 17–63)
AST: 15 U/L (ref 15–41)
Albumin: 3.6 g/dL (ref 3.5–5.0)
Alkaline Phosphatase: 83 U/L (ref 38–126)
Anion gap: 10 (ref 5–15)
BUN: 16 mg/dL (ref 6–20)
CHLORIDE: 100 mmol/L — AB (ref 101–111)
CO2: 27 mmol/L (ref 22–32)
Calcium: 9.1 mg/dL (ref 8.9–10.3)
Creatinine, Ser: 0.93 mg/dL (ref 0.61–1.24)
Glucose, Bld: 105 mg/dL — ABNORMAL HIGH (ref 65–99)
POTASSIUM: 4.1 mmol/L (ref 3.5–5.1)
SODIUM: 137 mmol/L (ref 135–145)
Total Bilirubin: 0.1 mg/dL — ABNORMAL LOW (ref 0.3–1.2)
Total Protein: 7.5 g/dL (ref 6.5–8.1)

## 2017-02-16 LAB — CBC WITH DIFFERENTIAL/PLATELET
Basophils Absolute: 0 10*3/uL (ref 0.0–0.1)
Basophils Relative: 0 %
EOS ABS: 0.1 10*3/uL (ref 0.0–0.7)
EOS PCT: 1 %
HCT: 35.7 % — ABNORMAL LOW (ref 39.0–52.0)
Hemoglobin: 11.9 g/dL — ABNORMAL LOW (ref 13.0–17.0)
LYMPHS ABS: 1.3 10*3/uL (ref 0.7–4.0)
Lymphocytes Relative: 10 %
MCH: 28.2 pg (ref 26.0–34.0)
MCHC: 33.3 g/dL (ref 30.0–36.0)
MCV: 84.6 fL (ref 78.0–100.0)
MONO ABS: 1.3 10*3/uL — AB (ref 0.1–1.0)
Monocytes Relative: 10 %
Neutro Abs: 10.9 10*3/uL — ABNORMAL HIGH (ref 1.7–7.7)
Neutrophils Relative %: 79 %
PLATELETS: 454 10*3/uL — AB (ref 150–400)
RBC: 4.22 MIL/uL (ref 4.22–5.81)
RDW: 13.8 % (ref 11.5–15.5)
WBC: 13.6 10*3/uL — ABNORMAL HIGH (ref 4.0–10.5)

## 2017-02-16 LAB — VITAMIN B12: VITAMIN B 12: 404 pg/mL (ref 180–914)

## 2017-02-16 LAB — RETICULOCYTES
RBC.: 4.22 MIL/uL (ref 4.22–5.81)
RETIC CT PCT: 1.1 % (ref 0.4–3.1)
Retic Count, Absolute: 46.4 10*3/uL (ref 19.0–186.0)

## 2017-02-16 LAB — FOLATE: FOLATE: 16.7 ng/mL (ref >5.9)

## 2017-02-16 LAB — IRON AND TIBC
IRON: 16 ug/dL — AB (ref 45–182)
Saturation Ratios: 6 % — ABNORMAL LOW (ref 17.9–39.5)
TIBC: 274 ug/dL (ref 250–450)
UIBC: 258 ug/dL

## 2017-02-16 LAB — FERRITIN: Ferritin: 102 ng/mL (ref 24–336)

## 2017-02-16 NOTE — Assessment & Plan Note (Addendum)
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 AND Anemia, normocytic, normochromic AND Anxiety, bipolar, depression, H/O suicidal ideation- on Paxil and Seroquel   I personally reviewed and went over pathology results with the patient.  Pathology is reviewed with patient- poorly differentiated adenocarcinoma of distal esophagus.  I personally reviewed and went over radiographic studies with the patient.  The results are noted within this dictation.  I personally reviewed the images in PACS.  CT abd/pelvis imaging from 01/29/2017 reviewed with patient showing a nodular prostate and distal esophageal wall thickening.  CT chest on 02/15/2017 illustrates the extent of disease including measure of mass size- 9 cm mass involving the mid and distal esophagus invading the mediastinum and encases and invades the left mainstem bronchus and lymph node metastases.  I have reviewed the patient's clinical extent of disease with him.  I have reviewed typical treatment modality is chemoXRT versus chemotherapy alone.  This will be based upon PET scan results.  Patient is provided a patient booklet with education regarding esophageal cancer.  Patient will need a staging PET to complete radiographic staging and guide medical oncology recommendations.  Will refer to Interventional Radiology for port-a-cath insertion in preparation for chemotherapy.  I have called pathology and spoken to Iago at 1015 hours.  I have requested HER2 testing.  Will refer to nutrition for consultation.  He is able to consume soft foods and liquids without vomiting.  He consumes 3 ensures per day.  No indication for PEG tube at this time.  Labs today: CBC diff, CMET, anemia panel, retic count.  I  personally reviewed and went over laboratory results with the patient.  The results are noted within this dictation.  Return following PET scan to review results and discuss medical oncology recommendations.

## 2017-02-16 NOTE — Patient Instructions (Signed)
[place] at [facility] Discharge Instructions  RECOMMENDATIONS MADE BY THE CONSULTANT AND ANY TEST RESULTS WILL BE SENT TO YOUR REFERRING PHYSICIAN. Seen by Kirby Crigler PA today / Labs today , PET scan to be scheduled, IR for portacath placement, Referral to Nutrition Return 1-2 days after PET  Scan completed Thank you for choosing [place] at [facility] to provide your oncology and hematology care.  To afford each patient quality time with our provider, please arrive at least 15 minutes before your scheduled appointment time.    If you have a lab appointment with the [place] please come in thru the  Main Entrance and check in at the main information desk  You need to re-schedule your appointment should you arrive 10 or more minutes late.  We strive to give you quality time with our providers, and arriving late affects you and other patients whose appointments are after yours.  Also, if you no show three or more times for appointments you may be dismissed from the clinic at the providers discretion.     Again, thank you for choosing [facility].  Our hope is that these requests will decrease the amount of time that you wait before being seen by our physicians.       _____________________________________________________________  Should you have questions after your visit to [facility], please contact our office at [phone] between the hours of 8:30 a.m. and 4:30 p.m.  Voicemails left after 4:30 p.m. will not be returned until the following business day.  For prescription refill requests, have your pharmacy contact our office.       Resources For Cancer Patients and their Caregivers ? American Cancer Society: Can assist with transportation, wigs, general needs, runs Look Good Feel Better.        480-442-7641 ? Cancer Care: Provides financial assistance, online support groups, medication/co-pay  assistance.  1-800-813-HOPE 317-344-9054) ? [place] Assists [place] cancer patients and their families through emotional , educational and financial support.  (351)726-7059 ? [place] DSS Where to apply for food stamps, Medicaid and utility assistance. 320-719-0388 ? [facility]: Transportation to medical appointments. (914) 043-2249 ? Social Security Administration: May apply for disability if have a Stage IV cancer. 212-886-7026 407 844 3995 ? [employer], Disability and Transit Services: Assists with nutrition, care and transit needs. [place] Support Programs: @10RELATIVEDAYS @ > Cancer Support Group  2nd Tuesday of the month 1pm-2pm, Journey Room  > Creative Journey  3rd Tuesday of the month 1130am-1pm, Journey Room  > Look Good Feel Better  1st Wednesday of the month 10am-12 noon, Journey Room (Call [place] to register [phone])

## 2017-02-16 NOTE — Progress Notes (Signed)
[facility] Hematology/Oncology Consultation   Name: Kurt Fisher      MRN: [id number]    Location: Room/bed info not found  Date: 02/16/2017 Time:3:46 PM   REFERRING PHYSICIAN:  Hildred Laser, MD (GI)  REASON FOR CONSULT:  Esophageal Carcinoma   DIAGNOSIS:  Invasive poorly differentiated adenocarcinoma esophageal with intrinsic stenosis 37-41 cm from incisors with invasion into the middle of the mediastinum and encasing the left mainstem bronchus and mediastinal and bilateral hilar lymph node metastasis and enlarged gastrohepatic ligament node.  HISTORY OF PRESENT ILLNESS:   Kurt Fisher is a 56 y.o. male with a medical history significant for anxiety/depression, GERD who is referred to the [facility] for newly diagnosed Stage IVA (cT4bcN2cM0) adenocarcinoma of esophagus.  Oncology History   Stage IVA (cT4BcN2cM0) adenocarcinoma of esophagus at 37-41 cm from incisors with intrinsic stenosis and invasion into the middle of mediastinum, encasing and invading the left mainstem bronchus and nodal metastases clinically to the mediastinum, bilateral hilum, and gastrohepatic ligament.     Esophageal cancer ([place])   01/29/2017 Imaging    CT abd/pelvis: 1. No acute abnormality or change from prior exam. 2. Heterogeneous nodular prostate gland with hyperdense nodularity extending into the bladder base. 3. Moderate hiatal hernia with distal esophageal wall thickening. Wall thickening likely secondary to esophagitis/reflux, cannot exclude esophageal malignancy in the setting of decreased appetite and weight loss. Consider endoscopy.      02/09/2017 Pathology Results    EGD/Colonoscopy by Dr. Laural Golden- One moderate (circumferential scarring or stenosis; an endoscope may pass) malignant-appearing, intrinsic stenosis was found 37 to 41 cm from the incisors. This measured 8 mm (inner diameter) x 4 cm (in length) and was traversed.at the distal and it was polypoidal  or masslike. Biopsies were taken with a cold forceps for histology.      02/11/2017 Pathology Results    INVASIVE POORLY DIFFERENTIATED ADENOCARCINOMA.      02/15/2017 Imaging    CT chest- 1. There is a large mass involving the mid and distal esophagus. The mass invades the middle mediastinum and encases the left mainstem bronchus. 2. Evidence of mediastinal and bilateral hilar lymph node metastasis. Enlarged gastrohepatic ligament node is also noted. 3. Hiatal hernia.      02/16/2017 Cancer Staging    Cancer Staging Esophageal cancer [facility]) Staging form: Esophagus - Adenocarcinoma, AJCC 8th Edition - Clinical stage from 02/16/2017: Stage IVA (cT4b, cN2, cM0) - Signed by Baird Cancer, PA-C on 02/16/2017        Patient reports a history of nausea and vomiting and difficulty keeping food down. As result his appetite has been decreased and he could not eat. He did see GI for this issue to let you endoscopy by Dr. Melony Overly. Endoscopy discomfort and esophageal cancer. Biopsy-proven. He notes that also experiences abdominal pain. He reports a 53 pound weight loss over the past 3-4 months. Since his endoscopy, he notes that he is able to eat soft and liquid foods. He denies any nausea or vomiting recently since his endoscopy.  He does have a history of mental health issues including depression, anxiety, bipolar disease, and history of suicidal ideations.  He is here today to review pathology results and learn about medical oncology recommendations moving forward.  Review of Systems  Constitutional: Positive for malaise/fatigue and weight loss. Negative for chills and fever.  HENT: Negative.   Eyes: Negative.   Respiratory: Negative.  Negative for cough.  Cardiovascular: Negative.  Negative for chest pain and leg swelling.  Gastrointestinal: Positive for abdominal pain, blood in stool, nausea and vomiting. Negative for constipation, diarrhea and melena.  Genitourinary: Negative.     Musculoskeletal: Negative.   Skin: Negative.   Neurological: Positive for weakness.  Endo/Heme/Allergies: Negative.   Psychiatric/Behavioral: Positive for substance abuse and suicidal ideas (history of). The patient is nervous/anxious.      PAST MEDICAL HISTORY:   Past Medical History:  Diagnosis Date  . Anxiety   . Bipolar 1 disorder ([place])   . Depression   . Esophageal cancer ([place]) 02/16/2017  . GERD (gastroesophageal reflux disease)     ALLERGIES: No Known Allergies    MEDICATIONS: I have reviewed the patient's current medications.    Current Outpatient Prescriptions on File Prior to Visit  Medication Sig Dispense Refill  . pantoprazole (PROTONIX) 40 MG tablet Take 1 tablet (40 mg total) by mouth 2 (two) times daily before a meal. 60 tablet 3  . PARoxetine (PAXIL) 40 MG tablet Take 40 mg by mouth every morning.    Marland Kitchen QUEtiapine (SEROQUEL) 300 MG tablet Take 600 mg by mouth at bedtime.      No current facility-administered medications on file prior to visit.      PAST SURGICAL HISTORY Past Surgical History:  Procedure Laterality Date  . BIOPSY  02/09/2017   Procedure: BIOPSY;  Surgeon: Rogene Houston, MD;  [facility];  Service: Endoscopy;;  gastric/GE Junction/distal esophagus bx   . COLONOSCOPY N/A 02/09/2017   Procedure: COLONOSCOPY;  Surgeon: Rogene Houston, MD;  Location[facility];  Service: Endoscopy;  Laterality: N/A;  . ESOPHAGOGASTRODUODENOSCOPY N/A 02/09/2017   Procedure: ESOPHAGOGASTRODUODENOSCOPY (EGD);  Surgeon: Rogene Houston, MD;  Location[facility];  Service: Endoscopy;  Laterality: N/A;  2:40-moved up to 1255 per Ann    FAMILY HISTORY: Family History  Problem Relation Age of Onset  . Asthma Daughter   . Cancer Mother 38       lung  . Cancer Father   . Diabetes Father   . Diabetes Brother   . Heart attack Brother   . Cancer Paternal Grandmother   . Asthma Daughter   . Fibromyalgia Daughter   . Migraines Daughter   .  Anxiety disorder Daughter   . Mood Disorder Daughter   . Panic disorder Daughter   . OCD Daughter    He has 2 brothers and 1 sister. He has a family history of myocardial infarctions and diabetes mellitus. 15 year old daughter who is currently pregnant. She is healthy. 26 year old daughter who has fibromyalgia, migraines, anxiety, mood disorder, OCD, and history of meningitis. Mother deceased at the age of 19 secondary to lung cancer, she was a nonsmoker. Father deceased at the age of 65 secondary to gunshot suicide.  SOCIAL HISTORY:  reports that he quit smoking about 14 years ago. His smoking use included Cigarettes. He has never used smokeless tobacco. He reports that he uses drugs, including Marijuana, about 7 times per week. He reports that he does not drink alcohol.  He reports smoking marijuana couple times per day utilizing bowls as smoking delivery system.  He quit drinking approximately 20 years ago at which time he admits that he was not a heavy drinker. He is on disability from work secondary to his mental health issues. He is Psychologist, forensic and religion. He's been married since 1994. His wife suffers from fibromyalgia.  Social History   Social History  . Marital status:  Divorced    Spouse name: N/A  . Number of children: N/A  . Years of education: N/A   Social History Main Topics  . Smoking status: Former Smoker    Types: Cigarettes    Quit date: 02/17/2003  . Smokeless tobacco: Never Used     Comment: quit 14 yrs ago  . Alcohol use No  . Drug use: Yes    Frequency: 7.0 times per week    Types: Marijuana  . Sexual activity: Not Asked   Other Topics Concern  . None   Social History Narrative  . None    PERFORMANCE STATUS: The patient's performance status is 1 - Symptomatic but completely ambulatory  PHYSICAL EXAM: Most Recent Vital Signs: Blood pressure 109/69, pulse (!) 127, resp. rate 16, height 6' 4"  (1.93 m), weight 151 lb (68.5 kg), SpO2 100 %. BP 109/69 (BP  Location: Left Arm, Patient Position: Sitting)   Pulse (!) 127   Resp 16   Ht 6' 4"  (1.93 m)   Wt 151 lb (68.5 kg)   SpO2 100%   BMI 18.38 kg/m   General Appearance:    Alert, cooperative, no distress, appears older than stated age, cachectic, accompanied by his wife who is a retired Therapist, sports.  Head:    Normocephalic, without obvious abnormality, atraumatic  Eyes:    Conjunctiva/corneas clear, EOM's intact, both eyes       Ears:    Normal TM's and external ear canals, both ears  Nose:   Nares normal, septum midline, mucosa normal, no drainage    or sinus tenderness  Throat:   Lips, mucosa, and tongue normal; teeth and gums normal with fair dentition.  Neck:   Supple, symmetrical, trachea midline, no adenopathy  Back:     Symmetric, no curvature, ROM normal, no CVA tenderness  Lungs:     Clear to auscultation bilaterally, respirations unlabored, with decreased breath sounds bilaterally.  Chest wall:    No tenderness or deformity  Heart:    Regular rate and rhythm, S1 and S2 normal, no murmur, rub   or gallop  Abdomen:     Soft, non-tender, bowel sounds active all four quadrants,    no masses, no organomegaly  Genitalia:    Not examined  Rectal:    Not examined  Extremities:   Extremities normal, atraumatic, no cyanosis or edema  Pulses:   Not examined  Skin:   Skin color, texture, turgor normal, no rashes or lesions  Lymph nodes:   Cervical, supraclavicular, and axillary nodes normal  Neurologic:   CNII-XII intact. Normal strength, sensation and reflexes      throughout    LABORATORY DATA:  CBC    Component Value Date/Time   WBC 13.6 (H) 02/16/2017 1328   RBC 4.22 02/16/2017 1328   RBC 4.22 02/16/2017 1328   HGB 11.9 (L) 02/16/2017 1328   HCT 35.7 (L) 02/16/2017 1328   PLT 454 (H) 02/16/2017 1328   MCV 84.6 02/16/2017 1328   MCH 28.2 02/16/2017 1328   MCHC 33.3 02/16/2017 1328   RDW 13.8 02/16/2017 1328   LYMPHSABS 1.3 02/16/2017 1328   MONOABS 1.3 (H) 02/16/2017 1328    EOSABS 0.1 02/16/2017 1328   BASOSABS 0.0 02/16/2017 1328     Chemistry      Component Value Date/Time   NA 137 02/16/2017 1328   K 4.1 02/16/2017 1328   CL 100 (L) 02/16/2017 1328   CO2 27 02/16/2017 1328   BUN 16 02/16/2017 1328  CREATININE 0.93 02/16/2017 1328      Component Value Date/Time   CALCIUM 9.1 02/16/2017 1328   ALKPHOS 83 02/16/2017 1328   AST 15 02/16/2017 1328   ALT 9 (L) 02/16/2017 1328   BILITOT 0.1 (L) 02/16/2017 1328     No results found for: IRON, TIBC, FERRITIN No results found for: VITAMINB12 No results found for: FOLATE   RADIOGRAPHY: Ct Chest W Contrast  Result Date: 02/15/2017 CLINICAL DATA:  Esophageal adenocarcinoma. EXAM: CT CHEST WITH CONTRAST TECHNIQUE: Multidetector CT imaging of the chest was performed during intravenous contrast administration. CONTRAST:  56m ISOVUE-300 IOPAMIDOL (ISOVUE-300) INJECTION 61% COMPARISON:  None FINDINGS: Cardiovascular: Normal heart size.  No pericardial effusion. Mediastinum/Nodes: The trachea appears patent and is midline. Moderate size hiatal hernia. Large esophageal mass extends from the sub- carinal region to just above the level of the hiatal hernia. This measures approximately 9 cm in length. At the sub- carinal level, the tumor extends ventrally to involve and narrow the left mainstem bronchus, image 79 of series 2. Here the mass as a Index right paratracheal node measures 1.4 cm, image 47 of series 2. Sub- carinal nodal mass measures 2.1 cm, image 84 of series 2. 1.1 cm left hilar lymph node is identified. 1.1 cm right hilar node is also noted. Within the posterior mediastinum there is a retroesophageal node which measures 1.3 cm, image 63 of series 2. Lungs/Pleura: No pleural effusion identified. Biapical scarring noted. No suspicious pulmonary nodules or masses identified. Upper Abdomen: No acute abnormality. Enlarged gastrohepatic ligament lymph node measures 1.2 cm, image 158 of series 2. Musculoskeletal: No  aggressive lytic or sclerotic bone lesions. IMPRESSION: 1. There is a large mass involving the mid and distal esophagus. The mass invades the middle mediastinum and encases the left mainstem bronchus. 2. Evidence of mediastinal and bilateral hilar lymph node metastasis. Enlarged gastrohepatic ligament node is also noted. 3. Hiatal hernia. Electronically Signed   By: TKerby MoorsM.D.   On: 02/15/2017 10:50       PATHOLOGY:    Diagnosis Esophagogastric junction, biopsy - INVASIVE POORLY DIFFERENTIATED ADENOCARCINOMA. Microscopic Comment Dr. SGari Crownhas reviewed the case. The case was called to Dr. RLaural Goldenon 02/11/2017. JVicente MalesMD Pathologist, Electronic Signature (Case signed 02/11/2017)   ASSESSMENT/PLAN:   [place] Stage IVA (cT4BcN2cM0) adenocarcinoma of esophagus at 37-41 cm from incisors with intrinsic stenosis and invasion into the middle of mediastinum, encasing and invading the left mainstem bronchus and nodal metastases clinically to the mediastinum, bilateral hilum, and gastrohepatic ligament. AND Anemia, normocytic, normochromic AND Anxiety, bipolar, depression, H/O suicidal ideation- on Paxil and Seroquel   I personally reviewed and went over pathology results with the patient.  Pathology is reviewed with patient- poorly differentiated adenocarcinoma of distal esophagus.  I personally reviewed and went over radiographic studies with the patient.  The results are noted within this dictation.  I personally reviewed the images in PACS.  CT abd/pelvis imaging from 01/29/2017 reviewed with patient showing a nodular prostate and distal esophageal wall thickening.  CT chest on 02/15/2017 illustrates the extent of disease including measure of mass size- 9 cm mass involving the mid and distal esophagus invading the mediastinum and encases and invades the left mainstem bronchus and lymph node metastases.  I have reviewed the patient's clinical extent of disease with him.  I  have reviewed typical treatment modality is chemoXRT versus chemotherapy alone.  This will be based upon PET scan results.  Patient is provided a patient  booklet with education regarding esophageal cancer.  Patient will need a staging PET to complete radiographic staging and guide medical oncology recommendations.  Will refer to Interventional Radiology for port-a-cath insertion in preparation for chemotherapy.  I have called pathology and spoken to [place] at 1015 hours.  I have requested HER2 testing.  Will refer to nutrition for consultation.  He is able to consume soft foods and liquids without vomiting.  He consumes 3 ensures per day.  No indication for PEG tube at this time.  Labs today: CBC diff, CMET, anemia panel, retic count.  I personally reviewed and went over laboratory results with the patient.  The results are noted within this dictation.  Return following PET scan to review results and discuss medical oncology recommendations.   ORDERS PLACED FOR THIS ENCOUNTER: Orders Placed This Encounter  Procedures  . NM PET Image Initial (PI) Skull Base To Thigh  . IR FLUORO GUIDE PORT INSERTION RIGHT  . CBC with Differential  . Comprehensive metabolic panel  . Reticulocytes  . Vitamin B12  . Folate  . Iron and TIBC  . Ferritin  . Ambulatory referral to Social Work  . Amb Referral to Nutrition and Diabetic E    MEDICATIONS PRESCRIBED THIS ENCOUNTER: No orders of the defined types were placed in this encounter.   All questions were answered. The patient knows to call the clinic with any problems, questions or concerns. We can certainly see the patient much sooner if necessary.  Patient discussed with Dr. Talbert Cage and together we ascertained an up-to-date interval history, and examined the patient.  Dr. Talbert Cage developed the patient's assessment and plan.  This was a shared visit-consultation.  Her attestation will follow below.  This note is electronically signed by:  Doy Mince 02/16/2017 3:46 PM

## 2017-02-17 ENCOUNTER — Other Ambulatory Visit (HOSPITAL_COMMUNITY): Payer: Self-pay | Admitting: Oncology

## 2017-02-21 ENCOUNTER — Telehealth: Payer: Self-pay | Admitting: Gastroenterology

## 2017-02-21 ENCOUNTER — Telehealth (INDEPENDENT_AMBULATORY_CARE_PROVIDER_SITE_OTHER): Payer: Self-pay | Admitting: Internal Medicine

## 2017-02-21 MED ORDER — ONDANSETRON HCL 4 MG PO TABS: 4.0000 mg | ORAL_TABLET | Freq: Four times a day (QID) | ORAL | 0 refills | Status: DC | PRN

## 2017-02-21 NOTE — Telephone Encounter (Signed)
Pt requesting Zofran. Rx sent.

## 2017-02-21 NOTE — Telephone Encounter (Signed)
Dr.Rehman stated that he would call me on 02/21/2017. I will address this with him.

## 2017-02-21 NOTE — Telephone Encounter (Signed)
Patient called, would like something for nausea.  [phone]

## 2017-02-22 ENCOUNTER — Encounter: Payer: Self-pay | Admitting: *Deleted

## 2017-02-22 ENCOUNTER — Ambulatory Visit (HOSPITAL_COMMUNITY): Payer: No Typology Code available for payment source

## 2017-02-22 NOTE — Progress Notes (Signed)
[facility] Psychosocial Distress Screening Clinical Social Work  Clinical Social Work was referred by distress screening protocol.  The patient scored a 7 on the Psychosocial Distress Thermometer which indicates severe distress. Clinical Social Worker phoned pt to assess for distress and other psychosocial needs. CSW introduced self, explained role of CSW/Pt and Family Support Team, support groups and other resources to assist. Pt reports to have a long history of anxiety, depression and bipolar disorder. He reports being on disability for the same. He uses [facility] and Dr. Lin Landsman to manage these concerns. CSW stressed the importance of maintaining adequate mental health support during treatment for his cancer. He stated understanding. CSW reviewed common emotions and coping techniques.    He shared he has friends and family that are supportive, but lives alone. He is divorced and his ex-wife is a Marine scientist and "has been coming to appointments and very helpful". He reports to have two daughters that live locally that are "taking things very hard".  CSW encouraged pt and family to utilize resources and support programs to aide in coping. Pt plans to explore these options and CSW encouraged pt to reach out as needed.   ONCBCN DISTRESS SCREENING 02/16/2017  Screening Type Initial Screening  Distress experienced in past week (1-10) 7  Practical problem type Insurance  Family Problem type Children  Emotional problem type Depression;Nervousness/Anxiety;Isolation/feeling alone  Spiritual/Religous concerns type Loss of Faith  Information Concerns Type Lack of info about diagnosis;Lack of info about treatment  Physical Problem type Loss of appetitie;Other (comment)    Clinical Social Worker follow up needed: No.  If yes, follow up plan:  Loren Racer, [place], OSW-C [place] Tuesdays   [phone]

## 2017-02-22 NOTE — Telephone Encounter (Signed)
Patient got the prescription from Dr.Fields. I called the patient and ask that he call us when he gets close to being out of the medication.

## 2017-02-25 ENCOUNTER — Encounter (HOSPITAL_COMMUNITY): Payer: Self-pay

## 2017-02-25 ENCOUNTER — Telehealth: Payer: Self-pay | Admitting: Physician Assistant

## 2017-02-25 ENCOUNTER — Encounter (HOSPITAL_COMMUNITY): Payer: No Typology Code available for payment source | Attending: Oncology

## 2017-02-25 VITALS — BP 101/67 | HR 88 | Temp 98.5°F | Resp 16

## 2017-02-25 DIAGNOSIS — Z79899 Other long term (current) drug therapy: Secondary | ICD-10-CM | POA: Insufficient documentation

## 2017-02-25 DIAGNOSIS — Z825 Family history of asthma and other chronic lower respiratory diseases: Secondary | ICD-10-CM | POA: Insufficient documentation

## 2017-02-25 DIAGNOSIS — Z9889 Other specified postprocedural states: Secondary | ICD-10-CM | POA: Insufficient documentation

## 2017-02-25 DIAGNOSIS — D509 Iron deficiency anemia, unspecified: Secondary | ICD-10-CM

## 2017-02-25 DIAGNOSIS — Z833 Family history of diabetes mellitus: Secondary | ICD-10-CM | POA: Insufficient documentation

## 2017-02-25 DIAGNOSIS — Z8501 Personal history of malignant neoplasm of esophagus: Secondary | ICD-10-CM | POA: Insufficient documentation

## 2017-02-25 DIAGNOSIS — C155 Malignant neoplasm of lower third of esophagus: Secondary | ICD-10-CM | POA: Insufficient documentation

## 2017-02-25 DIAGNOSIS — Z8249 Family history of ischemic heart disease and other diseases of the circulatory system: Secondary | ICD-10-CM | POA: Insufficient documentation

## 2017-02-25 DIAGNOSIS — C771 Secondary and unspecified malignant neoplasm of intrathoracic lymph nodes: Secondary | ICD-10-CM | POA: Insufficient documentation

## 2017-02-25 DIAGNOSIS — F419 Anxiety disorder, unspecified: Secondary | ICD-10-CM | POA: Insufficient documentation

## 2017-02-25 DIAGNOSIS — Z801 Family history of malignant neoplasm of trachea, bronchus and lung: Secondary | ICD-10-CM | POA: Insufficient documentation

## 2017-02-25 DIAGNOSIS — R45851 Suicidal ideations: Secondary | ICD-10-CM | POA: Insufficient documentation

## 2017-02-25 DIAGNOSIS — F17210 Nicotine dependence, cigarettes, uncomplicated: Secondary | ICD-10-CM | POA: Insufficient documentation

## 2017-02-25 DIAGNOSIS — D649 Anemia, unspecified: Secondary | ICD-10-CM | POA: Insufficient documentation

## 2017-02-25 DIAGNOSIS — Z818 Family history of other mental and behavioral disorders: Secondary | ICD-10-CM | POA: Insufficient documentation

## 2017-02-25 DIAGNOSIS — R590 Localized enlarged lymph nodes: Secondary | ICD-10-CM | POA: Insufficient documentation

## 2017-02-25 DIAGNOSIS — C3402 Malignant neoplasm of left main bronchus: Secondary | ICD-10-CM | POA: Insufficient documentation

## 2017-02-25 DIAGNOSIS — K219 Gastro-esophageal reflux disease without esophagitis: Secondary | ICD-10-CM | POA: Insufficient documentation

## 2017-02-25 DIAGNOSIS — C7889 Secondary malignant neoplasm of other digestive organs: Secondary | ICD-10-CM | POA: Insufficient documentation

## 2017-02-25 DIAGNOSIS — R195 Other fecal abnormalities: Secondary | ICD-10-CM

## 2017-02-25 DIAGNOSIS — C781 Secondary malignant neoplasm of mediastinum: Secondary | ICD-10-CM | POA: Insufficient documentation

## 2017-02-25 DIAGNOSIS — K449 Diaphragmatic hernia without obstruction or gangrene: Secondary | ICD-10-CM | POA: Insufficient documentation

## 2017-02-25 DIAGNOSIS — F319 Bipolar disorder, unspecified: Secondary | ICD-10-CM | POA: Insufficient documentation

## 2017-02-25 MED ORDER — FERUMOXYTOL INJECTION 510 MG/17 ML
510.0000 mg | Freq: Once | INTRAVENOUS | Status: AC
  Administered 2017-02-25: 510 mg via INTRAVENOUS
  Filled 2017-02-25: qty 17

## 2017-02-25 MED ORDER — SODIUM CHLORIDE 0.9 % IV SOLN
Freq: Once | INTRAVENOUS | Status: AC
  Administered 2017-02-25: 14:00:00 via INTRAVENOUS

## 2017-02-25 NOTE — Telephone Encounter (Signed)
See note below pls advise  

## 2017-02-25 NOTE — Telephone Encounter (Signed)
Patient was just diagnosed with cancer and would like to know if he can get handicap placard  [phone]

## 2017-02-25 NOTE — Progress Notes (Signed)
1410:  Pt reports being lightheaded and dizzy after IV stick.  VSS assessed and noted to be hypotensive. Skin clammy, pale. Pt placed in supine position and RN remains at chairside with pt.    1657:  Pt reports he is feeling better; skin warm and dry.  Alert, in no distress.  VSS.    Tolerated infusion w/o adverse reaction.  Alert, in no distress.  VSS.  Discharged ambulatory.

## 2017-02-28 ENCOUNTER — Telehealth (HOSPITAL_COMMUNITY): Payer: Self-pay | Admitting: Emergency Medicine

## 2017-02-28 ENCOUNTER — Other Ambulatory Visit (HOSPITAL_COMMUNITY): Payer: Self-pay | Admitting: Emergency Medicine

## 2017-02-28 ENCOUNTER — Encounter (HOSPITAL_COMMUNITY)
Admission: RE | Admit: 2017-02-28 | Discharge: 2017-02-28 | Disposition: A | Discharge status: Home / Self Care | Payer: No Typology Code available for payment source | Source: Ambulatory Visit | Attending: Oncology | Admitting: Oncology

## 2017-02-28 ENCOUNTER — Other Ambulatory Visit (HOSPITAL_COMMUNITY): Payer: Self-pay | Admitting: Oncology

## 2017-02-28 DIAGNOSIS — C155 Malignant neoplasm of lower third of esophagus: Secondary | ICD-10-CM

## 2017-02-28 LAB — GLUCOSE, CAPILLARY: Glucose-Capillary: 103 mg/dL — ABNORMAL HIGH (ref 65–99)

## 2017-02-28 MED ORDER — ONDANSETRON HCL 8 MG PO TABS: 8.0000 mg | ORAL_TABLET | Freq: Three times a day (TID) | ORAL | 2 refills | Status: DC | PRN

## 2017-02-28 MED ORDER — FLUDEOXYGLUCOSE F - 18 (FDG) INJECTION
7.3000 | Freq: Once | INTRAVENOUS | Status: AC | PRN
  Administered 2017-02-28: 7.3 via INTRAVENOUS

## 2017-02-28 MED ORDER — PROCHLORPERAZINE MALEATE 10 MG PO TABS: 10.0000 mg | ORAL_TABLET | Freq: Four times a day (QID) | ORAL | 2 refills | Status: DC | PRN

## 2017-02-28 MED ORDER — HYDROCODONE-ACETAMINOPHEN 7.5-325 MG/15ML PO SOLN: 10.0000 mL | Freq: Four times a day (QID) | ORAL | 0 refills | Status: DC | PRN

## 2017-02-28 NOTE — Telephone Encounter (Signed)
Yes. I will give [place] plackard---please place on my desk.

## 2017-02-28 NOTE — Telephone Encounter (Signed)
Patient is aware form can be picked up at the front desk

## 2017-02-28 NOTE — Telephone Encounter (Signed)
Called wife and pt back with PET scan results.  Let them know we were going to place a RAD ONC referral. They want to go to [facility].  I told them they should call them in the next day or two with this appt.  Explained a little bit about who I was and what the navigator does.  My number was to given to them.  They were very appreciative.

## 2017-02-28 NOTE — Progress Notes (Signed)
pts wife called and stated that pt is having lots and nausea with sometimes vomiting and pain.  Sent in zofran and compazine.  Spoke with Dr Talbert Cage and pain medication printed and they can pick it up at the front desk this afternoon.  Verbalized understanding.

## 2017-03-01 ENCOUNTER — Encounter: Payer: Self-pay | Admitting: Radiation Oncology

## 2017-03-01 NOTE — Progress Notes (Signed)
GI Location of Tumor / Histology: Esophagus invasion to middle of mediastinum,encasing and invading the left mainstem bronchus and nodal metastases clinically to the mediastinum,b/l hilum,and gastrohepatic ligament  Kurt Fisher presented months ago with symptoms of: upper left quadrant pain, nausea and sporadic vomiting,  Biopsies of (if applicable) revealed: Diagnosis 02/09/17 Esophagogastric junction, biopsy - INVASIVE POORLY DIFFERENTIATED ADENOCARCINOMA  Past/Anticipated interventions by surgeon, if any: 02/09/17 EGD with bx, Peg tube placement 03/07/17  At [facility]  Past/Anticipated interventions by medical oncology, if any: At [facility],  Weight changes, if any: loss 35 lbs  Bowel/Bladder complaints, if any: constipation with stool softener daily, bladder okay, sometimes trouble starting his stream Nausea / Vomiting, if any: yes, 02/28/17 zofran and compazine ordered  Pain issues, if any: abdomen where peg tube is, Peg placed at [facility] yesterday and right port a cath also  Any blood per rectum:   No  SAFETY ISSUES: yes, weakness  Prior radiation? NO  Pacemaker/ICD? NO  Is the patient on methotrexate? No  Current Complaints/Details:Bipolar, anxiety, Divorced, Gerd,quit smoking cigarettes,no smokeless tobacco use,no alcohol or drug use BP (!) 87/55 Comment: RFA sitting  Pulse 93   Temp 98.1 F (36.7 C) (Oral)   Resp 20   Ht 6\' 4"  (1.93 m)   Wt 156 lb 6.4 oz (70.9 kg)   SpO2 100% Comment: room air  BMI 19.04 kg/m   Wt Readings from Last 3 Encounters:  03/08/17 156 lb 6.4 oz (70.9 kg)  03/07/17 153 lb (69.4 kg)  03/04/17 150 lb 12.8 oz (68.4 kg)    Allergies: NKA

## 2017-03-02 ENCOUNTER — Telehealth (HOSPITAL_COMMUNITY): Payer: Self-pay | Admitting: Emergency Medicine

## 2017-03-02 ENCOUNTER — Other Ambulatory Visit: Payer: Self-pay | Admitting: Radiology

## 2017-03-03 ENCOUNTER — Ambulatory Visit (HOSPITAL_COMMUNITY)
Admission: RE | Admit: 2017-03-03 | Discharge: 2017-03-03 | Disposition: A | Discharge status: Home / Self Care | Payer: Self-pay | Source: Ambulatory Visit | Attending: Oncology | Admitting: Oncology

## 2017-03-03 ENCOUNTER — Other Ambulatory Visit (HOSPITAL_COMMUNITY): Payer: Self-pay | Admitting: Oncology

## 2017-03-03 ENCOUNTER — Other Ambulatory Visit (HOSPITAL_COMMUNITY): Payer: Self-pay | Admitting: Adult Health

## 2017-03-03 ENCOUNTER — Ambulatory Visit (HOSPITAL_COMMUNITY): Payer: Self-pay

## 2017-03-03 ENCOUNTER — Encounter (HOSPITAL_COMMUNITY): Payer: Self-pay

## 2017-03-03 DIAGNOSIS — C159 Malignant neoplasm of esophagus, unspecified: Secondary | ICD-10-CM | POA: Insufficient documentation

## 2017-03-03 DIAGNOSIS — R1310 Dysphagia, unspecified: Secondary | ICD-10-CM | POA: Insufficient documentation

## 2017-03-03 DIAGNOSIS — R627 Adult failure to thrive: Secondary | ICD-10-CM | POA: Insufficient documentation

## 2017-03-03 DIAGNOSIS — C155 Malignant neoplasm of lower third of esophagus: Secondary | ICD-10-CM

## 2017-03-03 DIAGNOSIS — K219 Gastro-esophageal reflux disease without esophagitis: Secondary | ICD-10-CM | POA: Insufficient documentation

## 2017-03-03 DIAGNOSIS — F1290 Cannabis use, unspecified, uncomplicated: Secondary | ICD-10-CM | POA: Insufficient documentation

## 2017-03-03 DIAGNOSIS — D509 Iron deficiency anemia, unspecified: Secondary | ICD-10-CM

## 2017-03-03 DIAGNOSIS — Z87891 Personal history of nicotine dependence: Secondary | ICD-10-CM | POA: Insufficient documentation

## 2017-03-03 DIAGNOSIS — F419 Anxiety disorder, unspecified: Secondary | ICD-10-CM | POA: Insufficient documentation

## 2017-03-03 DIAGNOSIS — F319 Bipolar disorder, unspecified: Secondary | ICD-10-CM | POA: Insufficient documentation

## 2017-03-03 LAB — BASIC METABOLIC PANEL
ANION GAP: 6 (ref 5–15)
BUN: 11 mg/dL (ref 6–20)
CALCIUM: 8.7 mg/dL — AB (ref 8.9–10.3)
CHLORIDE: 102 mmol/L (ref 101–111)
CO2: 28 mmol/L (ref 22–32)
CREATININE: 0.97 mg/dL (ref 0.61–1.24)
GFR calc non Af Amer: >60 mL/min (ref >60)
Glucose, Bld: 109 mg/dL — ABNORMAL HIGH (ref 65–99)
Potassium: 4.1 mmol/L (ref 3.5–5.1)
SODIUM: 136 mmol/L (ref 135–145)

## 2017-03-03 LAB — CBC
HCT: 30.1 % — ABNORMAL LOW (ref 39.0–52.0)
HEMOGLOBIN: 10 g/dL — AB (ref 13.0–17.0)
MCH: 27 pg (ref 26.0–34.0)
MCHC: 33.2 g/dL (ref 30.0–36.0)
MCV: 81.4 fL (ref 78.0–100.0)
PLATELETS: 422 10*3/uL — AB (ref 150–400)
RBC: 3.7 MIL/uL — AB (ref 4.22–5.81)
RDW: 13.8 % (ref 11.5–15.5)
WBC: 9.3 10*3/uL (ref 4.0–10.5)

## 2017-03-03 LAB — PROTIME-INR
INR: 0.99
PROTHROMBIN TIME: 13.1 s (ref 11.4–15.2)

## 2017-03-03 LAB — APTT: aPTT: 36 seconds (ref 24–36)

## 2017-03-03 MED ORDER — SODIUM CHLORIDE 0.9 % IV SOLN
INTRAVENOUS | Status: DC
  Administered 2017-03-03: 14:00:00 via INTRAVENOUS

## 2017-03-03 MED ORDER — CEFAZOLIN SODIUM-DEXTROSE 2-4 GM/100ML-% IV SOLN: 2.0000 g | INTRAVENOUS | Status: DC

## 2017-03-03 NOTE — Telephone Encounter (Signed)
Called pt to let him know that IR could place his PEG tube at the same time as his port.  He would need to go by [facility] radiology and pick up Redi-Cat to drink.  He would need to drink this between 6 pm-12 pm.   Still arrive at the same time as the port appt.  And to still follow al the same directions as given previously.  He verbalized understanding.

## 2017-03-03 NOTE — H&P (Signed)
Chief Complaint: Patient was seen in consultation today for port placement and Gastrostomy tube at the request of [place] S  Referring Physician(s): Baird Cancer  Supervising Physician: Arne Cleveland  Patient Status: Kurt Fisher  History of Present Illness: Kurt Fisher is a 56 y.o. male with esophageal cancer. He is to begin chemotherapy soon as well as radiation. He needs port placement for chemotherapy but due to progressive dysphagia and weight loss, he is also set up for G-tube placement. He was given instructions to pick up barium and drink last pm. Unfortunately when he picked up the barium, he was given different instructions to drink it a few hours prior to his procedure time. He finished drinking it around 1100. PMHx, meds, labs, imaging reviewed. Has otherwise been NPO   Past Medical History:  Diagnosis Date  . Anxiety   . Bipolar 1 disorder ([place])   . Depression   . Esophageal cancer ([place]) 02/16/2017  . GERD (gastroesophageal reflux disease)     Past Surgical History:  Procedure Laterality Date  . BIOPSY  02/09/2017   Procedure: BIOPSY;  Surgeon: Rogene Houston, MD;  [facility]: [facility];  Service: Endoscopy;;  gastric/GE Junction/distal esophagus bx   . COLONOSCOPY N/A 02/09/2017   Procedure: COLONOSCOPY;  Surgeon: Rogene Houston, MD;  Location: [facility];  Service: Endoscopy;  Laterality: N/A;  . ESOPHAGOGASTRODUODENOSCOPY N/A 02/09/2017   Procedure: ESOPHAGOGASTRODUODENOSCOPY (EGD);  Surgeon: Rogene Houston, MD;  Location: [facility];  Service: Endoscopy;  Laterality: N/A;  2:40-moved up to 1255 per Lelon Frohlich    Allergies: Patient has no known allergies.  Medications: Prior to Admission medications   Medication Sig Start Date End Date Taking? Authorizing Provider  HYDROcodone-acetaminophen (HYCET) 7.5-325 mg/15 ml solution Take 10 mLs by mouth 4 (four) times daily as needed for moderate pain. 02/28/17   Twana First, MD  ondansetron  (ZOFRAN) 8 MG tablet Take 1 tablet (8 mg total) by mouth every 8 (eight) hours as needed for nausea or vomiting. 02/28/17   Twana First, MD  pantoprazole (PROTONIX) 40 MG tablet Take 1 tablet (40 mg total) by mouth 2 (two) times daily before a meal. 01/19/17   Setzer, Rona Ravens, NP  PARoxetine (PAXIL) 40 MG tablet Take 40 mg by mouth every morning.    [provider]  prochlorperazine (COMPAZINE) 10 MG tablet Take 1 tablet (10 mg total) by mouth every 6 (six) hours as needed for nausea or vomiting. 02/28/17   Twana First, MD  QUEtiapine (SEROQUEL) 300 MG tablet Take 600 mg by mouth at bedtime.     [provider]     Family History  Problem Relation Age of Onset  . Asthma Daughter   . Cancer Mother 68       lung  . Cancer Father   . Diabetes Father   . Diabetes Brother   . Heart attack Brother   . Cancer Paternal Grandmother   . Asthma Daughter   . Fibromyalgia Daughter   . Migraines Daughter   . Anxiety disorder Daughter   . Mood Disorder Daughter   . Panic disorder Daughter   . OCD Daughter     Social History   Social History  . Marital status: Divorced    Spouse name: N/A  . Number of children: N/A  . Years of education: N/A   Social History Main Topics  . Smoking status: Former Smoker    Types: Cigarettes    Quit date:  02/17/2003  . Smokeless tobacco: Never Used     Comment: quit 14 yrs ago  . Alcohol use No  . Drug use: Yes    Frequency: 7.0 times per week    Types: Marijuana  . Sexual activity: Not Asked   Other Topics Concern  . None   Social History Narrative  . None    Review of Systems: A 12 point ROS discussed and pertinent positives are indicated in the HPI above.  All other systems are negative.  Review of Systems  Vital Signs: There were no vitals taken for this visit.  Physical Exam  Constitutional: He is oriented to person, place, and time. He appears well-developed. No distress.  HENT:  Head: Normocephalic.  Mouth/Throat:  Oropharynx is clear and moist.  Neck: Normal range of motion. No JVD present. No tracheal deviation present.  Cardiovascular: Normal rate, regular rhythm and normal heart sounds.   Pulmonary/Chest: Effort normal and breath sounds normal. No respiratory distress.  Abdominal: Soft. He exhibits no distension. There is no tenderness.  Neurological: He is alert and oriented to person, place, and time.  Skin: Skin is warm and dry.  Psychiatric: He has a normal mood and affect. Judgment normal.    Mallampati Score:  MD Evaluation Airway: WNL Heart: WNL Abdomen: WNL Chest/ Lungs: WNL ASA  Classification: 2 Mallampati/Airway Score: Two  Imaging: Ct Chest W Contrast  Result Date: 02/15/2017 CLINICAL DATA:  Esophageal adenocarcinoma. EXAM: CT CHEST WITH CONTRAST TECHNIQUE: Multidetector CT imaging of the chest was performed during intravenous contrast administration. CONTRAST:  44mL ISOVUE-300 IOPAMIDOL (ISOVUE-300) INJECTION 61% COMPARISON:  None FINDINGS: Cardiovascular: Normal heart size.  No pericardial effusion. Mediastinum/Nodes: The trachea appears patent and is midline. Moderate size hiatal hernia. Large esophageal mass extends from the sub- carinal region to just above the level of the hiatal hernia. This measures approximately 9 cm in length. At the sub- carinal level, the tumor extends ventrally to involve and narrow the left mainstem bronchus, image 79 of series 2. Here the mass as a Index right paratracheal node measures 1.4 cm, image 47 of series 2. Sub- carinal nodal mass measures 2.1 cm, image 84 of series 2. 1.1 cm left hilar lymph node is identified. 1.1 cm right hilar node is also noted. Within the posterior mediastinum there is a retroesophageal node which measures 1.3 cm, image 63 of series 2. Lungs/Pleura: No pleural effusion identified. Biapical scarring noted. No suspicious pulmonary nodules or masses identified. Upper Abdomen: No acute abnormality. Enlarged gastrohepatic ligament  lymph node measures 1.2 cm, image 158 of series 2. Musculoskeletal: No aggressive lytic or sclerotic bone lesions. IMPRESSION: 1. There is a large mass involving the mid and distal esophagus. The mass invades the middle mediastinum and encases the left mainstem bronchus. 2. Evidence of mediastinal and bilateral hilar lymph node metastasis. Enlarged gastrohepatic ligament node is also noted. 3. Hiatal hernia. Electronically Signed   By: Kerby Moors M.D.   On: 02/15/2017 10:50   Nm Pet Image Initial (pi) Skull Base To Thigh  Result Date: 02/28/2017 CLINICAL DATA:  Initial treatment strategy for esophageal cancer. EXAM: NUCLEAR MEDICINE PET SKULL BASE TO THIGH TECHNIQUE: 7.3 mCi F-18 FDG was injected intravenously. Full-ring PET imaging was performed from the skull base to thigh after the radiotracer. CT data was obtained and used for attenuation correction and anatomic localization. FASTING BLOOD GLUCOSE:  Value: 103 mg/dl COMPARISON:  Multiple exams, including CT scans from 02/14/2017 and 01/29/2017 FINDINGS: NECK No hypermetabolic lymph nodes in  the neck. CHEST The large esophageal mass extends down from about the level of the carina to the gastroesophageal junction in the vicinity of a small type 1 hiatal hernia. Maximum SUV in this vicinity is 28.4. This measurement does include the multiple hypermetabolic paraesophageal lymph nodes which are not readily separable from the esophagus except in a few individual cases. These paraesophageal lymph nodes are primarily in the vicinity of the esophageal mass although extend into the subcarinal region, around greater than 180 degrees of the left mainstem bronchus, in the right paratracheal region, and in the left and perhaps right supraclavicular fossa. Index right paratracheal node measures 1.1 cm in short axis on image 67/4 and has a maximum SUV of 16.8. An upper left paraesophageal lymph node above the level of the tumor measures 1.3 cm in short axis on image 75/4  with maximum SUV 11.2. A rim calcified subcarinal lymph node is noted but is not overtly hypermetabolic. The right hilar and left infrahilar lymph nodes shown on the prior CT chest are not significantly hypermetabolic. Mild biapical pleuroparenchymal scarring in the lungs. ABDOMEN/PELVIS A node adjacent to the gastric cardia (anulus lymphaticus cardiae) measures 1.4 cm in short axis on image 121/4, with maximum SUV 12.8. A right periaortic lymph node at the level of the renal vein measures 1.0 cm in short axis on image 135/4, with maximum SUV 13.7. Several additional small periaortic lymph nodes are present. Faintly hypermetabolic activity of both adrenal glands noted without overt adrenal mass seen. Left adrenal, and maximum SUV 6.1, right adrenal gland maximum SUV 5.2. Activity in the left hemipelvis is thought to be in the ureter. Accentuated activity in the cecum and proximal ascending colon, thought to be physiologic. Aortoiliac atherosclerotic vascular disease. Median lobe of the prostate gland indents the bladder base. SKELETON No focal hypermetabolic activity to suggest skeletal metastasis. IMPRESSION: 1. Large mass in the distal half of the thoracic esophagus, maximum SUV 28.4, compatible primary malignancy. Multiple associated metastatic lymph nodes in the mediastinum and below the diaphragm adjacent to the gastric cardia and in the retroperitoneum. 2. Faintly accentuated activity in both adrenal glands without visible mass, this activity could be physiologic but merits surveillance. 3.  Aortic Atherosclerosis (ICD10-I70.0). Electronically Signed   By: Van Clines M.D.   On: 02/28/2017 09:23    Labs:  CBC:  Recent Labs  12/26/16 1532 01/29/17 2026 02/16/17 1328 03/03/17 1317  WBC 12.1* 14.2* 13.6* 9.3  HGB 13.9 10.2* 11.9* 10.0*  HCT 40.3 30.3* 35.7* 30.1*  PLT 347 363 454* 422*    COAGS:  Recent Labs  03/03/17 1317  INR 0.99  APTT 36    BMP:  Recent Labs   12/26/16 1532 01/29/17 2026 02/16/17 1328  NA 141 139 137  K 4.2 4.0 4.1  CL 104 104 100*  CO2 25 28 27   GLUCOSE 139* 135* 105*  BUN 24* 19 16  CALCIUM 9.7 8.4* 9.1  CREATININE 1.15 1.00 0.93  GFRNONAA >60 >60 >60  GFRAA >60 >60 >60    LIVER FUNCTION TESTS:  Recent Labs  12/26/16 1532 01/29/17 2026 02/16/17 1328  BILITOT 0.4 0.5 0.1*  AST 16 13* 15  ALT 16* 11* 9*  ALKPHOS 90 73 83  PROT 7.6 6.4* 7.5  ALBUMIN 3.8 3.1* 3.6    TUMOR MARKERS: No results for input(s): AFPTM, CEA, CA199, CHROMGRNA in the last 8760 hours.  Assessment and Plan: Esophageal cancer with dysphagia and failure to thrive Plan for port placement and G-tube  placement. Will have to see if barium is cleared from stomach before we can safely proceed with sedation. May not be able to do either case today. Risks and benefits of port placement discussed with the patient including, but not limited to bleeding, infection, pneumothorax, or fibrin sheath development and need for additional procedures. Risks and benefits of G-tube placement discussed with the patient including, but not limited to the need for a barium enema during the procedure, bleeding, infection, peritonitis, or damage to adjacent structures. All of the patient's questions were answered, patient is agreeable to proceed. Consent signed and in chart.    Thank you for this interesting consult.  I greatly enjoyed meeting Kurt Fisher and look forward to participating in their care.  A copy of this report was sent to the requesting provider on this date.  Electronically Signed: Ascencion Dike, PA-C 03/03/2017, 1:49 PM   I spent a total of 25 minutes in face to face in clinical consultation, greater than 50% of which was counseling/coordinating care for port and G-tube placement

## 2017-03-04 ENCOUNTER — Encounter (HOSPITAL_COMMUNITY): Payer: Self-pay | Admitting: Oncology

## 2017-03-04 ENCOUNTER — Other Ambulatory Visit: Payer: Self-pay | Admitting: Radiology

## 2017-03-04 ENCOUNTER — Encounter (HOSPITAL_BASED_OUTPATIENT_CLINIC_OR_DEPARTMENT_OTHER): Payer: No Typology Code available for payment source | Admitting: Oncology

## 2017-03-04 ENCOUNTER — Other Ambulatory Visit (HOSPITAL_COMMUNITY): Payer: Self-pay | Admitting: Adult Health

## 2017-03-04 ENCOUNTER — Other Ambulatory Visit: Payer: Self-pay | Admitting: Student

## 2017-03-04 ENCOUNTER — Other Ambulatory Visit: Payer: Self-pay | Admitting: Physician Assistant

## 2017-03-04 ENCOUNTER — Encounter (HOSPITAL_COMMUNITY): Payer: No Typology Code available for payment source

## 2017-03-04 VITALS — BP 93/57 | HR 109 | Temp 97.9°F | Resp 18 | Wt 150.8 lb

## 2017-03-04 DIAGNOSIS — C159 Malignant neoplasm of esophagus, unspecified: Secondary | ICD-10-CM

## 2017-03-04 DIAGNOSIS — R634 Abnormal weight loss: Secondary | ICD-10-CM

## 2017-03-04 DIAGNOSIS — C155 Malignant neoplasm of lower third of esophagus: Secondary | ICD-10-CM

## 2017-03-04 MED ORDER — OSMOLITE 1.5 CAL PO LIQD: ORAL | 0 refills | Status: AC

## 2017-03-04 NOTE — Progress Notes (Signed)
[facility] Hematology/Oncology Consultation   Name: Kurt Fisher      MRN: [id number]    Location: Room/bed info not found  Date: 03/04/2017 Time:10:09 AM   REFERRING PHYSICIAN:  Hildred Laser, MD (GI)  REASON FOR CONSULT:  Esophageal Carcinoma   DIAGNOSIS:  Invasive poorly differentiated adenocarcinoma esophageal with intrinsic stenosis 37-41 cm from incisors with invasion into the middle of the mediastinum and encasing the left mainstem bronchus and mediastinal and bilateral hilar lymph node metastasis and enlarged gastrohepatic ligament node.  Patient was supposed to get gastrostomy tube placed and port placed but could not do it Has been scheduled on Monday at [facility].   Patient also has been scheduled for port placement. Also has an appointment with radiation oncologist    HISTORY OF PRESENT ILLNESS:   Kurt Fisher is a 56 y.o. male with a medical history significant for anxiety/depression, GERD who is referred to the [facility] for newly diagnosed Stage IVA (cT4bcN2cM0) adenocarcinoma of esophagus.  Oncology History   Stage IVA (cT4BcN2cM0) adenocarcinoma of esophagus at 37-41 cm from incisors with intrinsic stenosis and invasion into the middle of mediastinum, encasing and invading the left mainstem bronchus and nodal metastases clinically to the mediastinum, bilateral hilum, and gastrohepatic ligament.     Esophageal cancer ([place])   01/29/2017 Imaging    CT abd/pelvis: 1. No acute abnormality or change from prior exam. 2. Heterogeneous nodular prostate gland with hyperdense nodularity extending into the bladder base. 3. Moderate hiatal hernia with distal esophageal wall thickening. Wall thickening likely secondary to esophagitis/reflux, cannot exclude esophageal malignancy in the setting of decreased appetite and weight loss. Consider endoscopy.      02/09/2017 Pathology Results    EGD/Colonoscopy by Dr. Laural Golden- One moderate  (circumferential scarring or stenosis; an endoscope may pass) malignant-appearing, intrinsic stenosis was found 37 to 41 cm from the incisors. This measured 8 mm (inner diameter) x 4 cm (in length) and was traversed.at the distal and it was polypoidal or masslike. Biopsies were taken with a cold forceps for histology.      02/11/2017 Pathology Results    INVASIVE POORLY DIFFERENTIATED ADENOCARCINOMA.      02/15/2017 Imaging    CT chest- 1. There is a large mass involving the mid and distal esophagus. The mass invades the middle mediastinum and encases the left mainstem bronchus. 2. Evidence of mediastinal and bilateral hilar lymph node metastasis. Enlarged gastrohepatic ligament node is also noted. 3. Hiatal hernia.      02/16/2017 Cancer Staging    Cancer Staging Esophageal cancer [facility]) Staging form: Esophagus - Adenocarcinoma, AJCC 8th Edition - Clinical stage from 02/16/2017: Stage IVA (cT4b, cN2, cM0) - Signed by Baird Cancer, PA-C on 02/16/2017        Patient reports a history of nausea and vomiting and difficulty keeping food down. As result his appetite has been decreased and he could not eat. He did see GI for this issue to let you endoscopy by Dr. Melony Overly. Endoscopy discomfort and esophageal cancer. Biopsy-proven. He notes that also experiences abdominal pain. He reports a 53 pound weight loss over the past 3-4 months. Since his endoscopy, he notes that he is able to eat soft and liquid foods. He denies any nausea or vomiting recently since his endoscopy.  He does have a history of mental health issues including depression, anxiety, bipolar disease, and history of suicidal ideations.  He is here today to  review pathology results and learn about medical oncology recommendations moving forward.  Review of Systems  Constitutional: Positive for malaise/fatigue and weight loss. Negative for chills and fever.  HENT: Negative.   Eyes: Negative.   Respiratory: Negative.   Negative for cough.   Cardiovascular: Negative.  Negative for chest pain and leg swelling.  Gastrointestinal: Positive for abdominal pain, blood in stool, nausea and vomiting. Negative for constipation, diarrhea and melena.  Genitourinary: Negative.   Musculoskeletal: Negative.   Skin: Negative.   Neurological: Positive for weakness.  Endo/Heme/Allergies: Negative.   Psychiatric/Behavioral: Positive for substance abuse and suicidal ideas (history of). The patient is nervous/anxious.      PAST MEDICAL HISTORY:   Past Medical History:  Diagnosis Date  . Anxiety   . Bipolar 1 disorder ([place])   . Depression   . Esophageal cancer ([place]) 02/16/2017  . GERD (gastroesophageal reflux disease)     ALLERGIES: No Known Allergies    MEDICATIONS: I have reviewed the patient's current medications.    Current Outpatient Prescriptions on File Prior to Visit  Medication Sig Dispense Refill  . HYDROcodone-acetaminophen (HYCET) 7.5-325 mg/15 ml solution Take 10 mLs by mouth 4 (four) times daily as needed for moderate pain. 120 mL 0  . ondansetron (ZOFRAN) 8 MG tablet Take 1 tablet (8 mg total) by mouth every 8 (eight) hours as needed for nausea or vomiting. 30 tablet 2  . pantoprazole (PROTONIX) 40 MG tablet Take 1 tablet (40 mg total) by mouth 2 (two) times daily before a meal. 60 tablet 3  . PARoxetine (PAXIL) 40 MG tablet Take 40 mg by mouth every morning.    . prochlorperazine (COMPAZINE) 10 MG tablet Take 1 tablet (10 mg total) by mouth every 6 (six) hours as needed for nausea or vomiting. 30 tablet 2  . QUEtiapine (SEROQUEL) 300 MG tablet Take 600 mg by mouth at bedtime.      No current facility-administered medications on file prior to visit.      PAST SURGICAL HISTORY Past Surgical History:  Procedure Laterality Date  . BIOPSY  02/09/2017   Procedure: BIOPSY;  Surgeon: Rogene Houston, MD;  [facility];  Service: Endoscopy;;  gastric/GE Junction/distal esophagus bx   .  COLONOSCOPY N/A 02/09/2017   Procedure: COLONOSCOPY;  Surgeon: Rogene Houston, MD;  Location: [facility];  Service: Endoscopy;  Laterality: N/A;  . ESOPHAGOGASTRODUODENOSCOPY N/A 02/09/2017   Procedure: ESOPHAGOGASTRODUODENOSCOPY (EGD);  Surgeon: Rogene Houston, MD;  Location: [facility];  Service: Endoscopy;  Laterality: N/A;  2:40-moved up to 1255 per Ann    FAMILY HISTORY: Family History  Problem Relation Age of Onset  . Asthma Daughter   . Cancer Mother 15       lung  . Cancer Father   . Diabetes Father   . Diabetes Brother   . Heart attack Brother   . Cancer Paternal Grandmother   . Asthma Daughter   . Fibromyalgia Daughter   . Migraines Daughter   . Anxiety disorder Daughter   . Mood Disorder Daughter   . Panic disorder Daughter   . OCD Daughter    He has 2 brothers and 1 sister. He has a family history of myocardial infarctions and diabetes mellitus. 54 year old daughter who is currently pregnant. She is healthy. 92 year old daughter who has fibromyalgia, migraines, anxiety, mood disorder, OCD, and history of meningitis. Mother deceased at the age of 65 secondary to lung cancer, she was a nonsmoker. Father deceased at the  age of 74 secondary to gunshot suicide.  SOCIAL HISTORY:  reports that he quit smoking about 14 years ago. His smoking use included Cigarettes. He has never used smokeless tobacco. He reports that he uses drugs, including Marijuana, about 7 times per week. He reports that he does not drink alcohol.  He reports smoking marijuana couple times per day utilizing bowls as smoking delivery system.  He quit drinking approximately 20 years ago at which time he admits that he was not a heavy drinker. He is on disability from work secondary to his mental health issues. He is Psychologist, forensic and religion. He's been married since 1994. His wife suffers from fibromyalgia.  Social History   Social History  . Marital status: Divorced    Spouse name: N/A  . Number of  children: N/A  . Years of education: N/A   Social History Main Topics  . Smoking status: Former Smoker    Types: Cigarettes    Quit date: 02/17/2003  . Smokeless tobacco: Never Used     Comment: quit 14 yrs ago  . Alcohol use No  . Drug use: Yes    Frequency: 7.0 times per week    Types: Marijuana  . Sexual activity: Not on file   Other Topics Concern  . Not on file   Social History Narrative  . No narrative on file    PERFORMANCE STATUS: The patient's performance status is 1 - Symptomatic but completely ambulatory  PHYSICAL EXAM: Most Recent Vital Signs: There were no vitals taken for this visit. There were no vitals taken for this visit.  General Appearance:    Alert, cooperative, no distress, appears older than stated age, cachectic, accompanied by his wife who is a retired Therapist, sports.  Head:    Normocephalic, without obvious abnormality, atraumatic  Eyes:    Conjunctiva/corneas clear, EOM's intact, both eyes       Ears:    Normal TM's and external ear canals, both ears  Nose:   Nares normal, septum midline, mucosa normal, no drainage    or sinus tenderness  Throat:   Lips, mucosa, and tongue normal; teeth and gums normal with fair dentition.  Neck:   Supple, symmetrical, trachea midline, no adenopathy  Back:     Symmetric, no curvature, ROM normal, no CVA tenderness  Lungs:     Clear to auscultation bilaterally, respirations unlabored, with decreased breath sounds bilaterally.  Chest wall:    No tenderness or deformity  Heart:    Regular rate and rhythm, S1 and S2 normal, no murmur, rub   or gallop  Abdomen:     Soft, non-tender, bowel sounds active all four quadrants,    no masses, no organomegaly  Genitalia:    Not examined  Rectal:    Not examined  Extremities:   Extremities normal, atraumatic, no cyanosis or edema  Pulses:   Not examined  Skin:   Skin color, texture, turgor normal, no rashes or lesions  Lymph nodes:   Cervical, supraclavicular, and axillary nodes normal    Neurologic:   CNII-XII intact. Normal strength, sensation and reflexes      throughout    LABORATORY DATA:  CBC    Component Value Date/Time   WBC 9.3 03/03/2017 1317   RBC 3.70 (L) 03/03/2017 1317   HGB 10.0 (L) 03/03/2017 1317   HCT 30.1 (L) 03/03/2017 1317   PLT 422 (H) 03/03/2017 1317   MCV 81.4 03/03/2017 1317   MCH 27.0 03/03/2017 1317   MCHC 33.2  03/03/2017 1317   RDW 13.8 03/03/2017 1317   LYMPHSABS 1.3 02/16/2017 1328   MONOABS 1.3 (H) 02/16/2017 1328   EOSABS 0.1 02/16/2017 1328   BASOSABS 0.0 02/16/2017 1328     Chemistry      Component Value Date/Time   NA 136 03/03/2017 1317   K 4.1 03/03/2017 1317   CL 102 03/03/2017 1317   CO2 28 03/03/2017 1317   BUN 11 03/03/2017 1317   CREATININE 0.97 03/03/2017 1317      Component Value Date/Time   CALCIUM 8.7 (L) 03/03/2017 1317   ALKPHOS 83 02/16/2017 1328   AST 15 02/16/2017 1328   ALT 9 (L) 02/16/2017 1328   BILITOT 0.1 (L) 02/16/2017 1328     Lab Results  Component Value Date   IRON 16 (L) 02/16/2017   TIBC 274 02/16/2017   FERRITIN 102 02/16/2017   Lab Results  Component Value Date   VITAMINB12 404 02/16/2017   Lab Results  Component Value Date   FOLATE 16.7 02/16/2017     RADIOGRAPHY: Dg Abd 1 View  Result Date: 03/03/2017 CLINICAL DATA:  Esophageal neoplasm.  Concern for barium in stomach EXAM: ABDOMEN - 1 VIEW COMPARISON:  None. FINDINGS: There is a small amount of contrast in the distal small bowel region in the pelvis. There is no contrast seen in the region of the stomach. There is diffuse stool throughout the colon. There is no bowel dilatation or air-fluid level to suggest bowel obstruction. No free air. No abnormal calcifications are evident. IMPRESSION: Small amount of contrast in distal loops of small bowel in the pelvis. No gastric contrast evident. Diffuse stool in colon.  No bowel obstruction or free air evident. Electronically Signed   By: Lowella Grip III M.D.   On: 03/03/2017  14:16       PATHOLOGY:    Diagnosis Esophagogastric junction, biopsy - INVASIVE POORLY DIFFERENTIATED ADENOCARCINOMA. Microscopic Comment Dr. Gari Crown has reviewed the case. The case was called to Dr. Laural Golden on 02/11/2017. Vicente Males MD Pathologist, Electronic Signature (Case signed 02/11/2017)   ASSESSMENT/PLAN:   No problem-specific Assessment & Plan notes found for this encounter.   ORDERS PLACED FOR THIS ENCOUNTER: Orders Placed This Encounter  Procedures  . IR GASTROSTOMY TUBE MOD SED    MEDICATIONS PRESCRIBED THIS ENCOUNTER: No orders of the defined types were placed in this encounter.  Depending on gastrostomy tube placement and port placement on Monday and depending on the radiation oncologist consult whether primary radiation therapy in combination with weekly chemotherapy with carboplatinum and Taxol or if primary radiation therapy is not planned then carboplatin and Taxol either can be given 3 weeks on 1 week off for a few cycles and reevaluation can be planned.  Patient and number of questions which were answered Patient is seeing dietitian today as well as meeting with navigator. Depending on radiation oncology consult further treatment would be planned  This note is electronically signed by: Forest Gleason, MD 03/04/2017 10:09 AM

## 2017-03-04 NOTE — Progress Notes (Addendum)
Nutrition Assessment   Reason for Assessment:   Planning PEG placement  ASSESSMENT:  56 year old male with new diagnosis of stage IV esophageal cancer.  Planning chemotherapy and radiation therapy.  Planning to have port placed and PEG tube placed on Monday, August 13th.  Past medical history of anxiety, bipolar, depression, GERD  Patient seen in clinic this am with wife Madilyn.  Patient reports for the past 3-4 months decreased intake due to dysphagia, abdominal pain, nausea, vomiting.  Patient reports that he is drinking about 3 (generic ensure) shakes per day.  Wife reports that she has been adding ice cream and or yogurt and fruit to shakes to increase nutrition.  Has also been eating mashed potatoes, yogurt, jello, boiled squash, pudding, ice cream.    Nutrition Focused Physical Exam: deferred today  Medications: hycet, zofran, protonix, compazine  Labs: glucose 109, Fe 16  Anthropometrics:   Height: 76 inches Weight: 151 lb UBW: 218 lb in March 2018 per patient BMI: 18  31% weight loss in the last 5 months, significant   Estimated Energy Needs  Kcals: 2070-2415 calories/d Protein: 103-138 g/d Fluid: 2.4 L/d  NUTRITION DIAGNOSIS: Inadequate oral intake related to esophageal cancer as evidenced by 25% weight loss in 5 months and eating < or equal to 75% energy needs for > or equal to 1 months   MALNUTRITION DIAGNOSIS: Patient meets criteria for severe malnutrition in context of chronic illness as evidenced by 25% weight loss in 5 months and eating < or equal to 75% of energy need for > or equal to 1 month   INTERVENTION:   Showed patient and wife sample feeding tube.  Discussed PEG care and administration of feeding and water flush.   Discussed tube feeding bolus vs continuous feeding options.  Patient wants to try bolus feeding first.  Recommend osmolite 1.5, 6 cans daily with promod 60 ml daily if patient unable to eat anything orally.  This will provide 2330  calories and 109 g of protein and 1086 ml free water (formula only).  Patient to give 1 1/2 cans of tube feeding QID with 67ml of water flush before and after feeding.  Patient will need to drink additional 3, 8 oz cups of water daily to better meet hydration needs.   Recommend for patient to start with 1/2 can of tube feeding 4 times per day with water flush of 23ml before and 41ml after.  Patient agreeable to [facility] for tube feeding supplies.  Will contact representative today.  Recommend checking CMP, Mag and Phosphorus as patient at risk of refeeding.  Discussed with NP, Gretchen and patient scheduled for labs on 8/16 at 11:10. Left message for patient on his cell phone regarding lab appointment and reason for labs.  Also left message on wife's phone as well. Ask for return phone call if questions. Discussed smooth, creamy liquid foods for patient to continue to try and consume.  Discussed oral nutrition supplements and gave patient 1st complimentary case of ensure plus today in clinic.  Also gave coupons and sample of boost plus.      MONITORING, EVALUATION, GOAL: Patient will utilize feeding tube to improve nutrition and prevent further weight loss   NEXT VISIT: phone call next week  Alphonsine Minium B. Zenia Resides, [place], [place] Registered Dietitian [phone] (pager)

## 2017-03-04 NOTE — Patient Instructions (Signed)
[place] at [facility] Discharge Instructions  RECOMMENDATIONS MADE BY THE CONSULTANT AND ANY TEST RESULTS WILL BE SENT TO YOUR REFERRING PHYSICIAN.  You were seen today by Dr. Forest Gleason  You will have your port a cath and Peg tube placed on Monday We will coordinate your chemotherapy with your radiation treatments.  Thank you for choosing [place] at [facility] to provide your oncology and hematology care.  To afford each patient quality time with our provider, please arrive at least 15 minutes before your scheduled appointment time.    If you have a lab appointment with the [facility] please come in thru the  Main Entrance and check in at the main information desk  You need to re-schedule your appointment should you arrive 10 or more minutes late.  We strive to give you quality time with our providers, and arriving late affects you and other patients whose appointments are after yours.  Also, if you no show three or more times for appointments you may be dismissed from the clinic at the providers discretion.     Again, thank you for choosing [facility].  Our hope is that these requests will decrease the amount of time that you wait before being seen by our physicians.       _____________________________________________________________  Should you have questions after your visit to [facility], please contact our office at [phone] between the hours of 8:30 a.m. and 4:30 p.m.  Voicemails left after 4:30 p.m. will not be returned until the following business day.  For prescription refill requests, have your pharmacy contact our office.       Resources For Cancer Patients and their Caregivers ? American Cancer Society: Can assist with transportation, wigs, general needs, runs Look Good Feel Better.        276-138-4830 ? Cancer Care: Provides financial assistance, online support groups,  medication/co-pay assistance.  1-800-813-HOPE 217-358-3300) ? [place] Assists [place] cancer patients and their families through emotional , educational and financial support.  515-300-6528 ? [place] DSS Where to apply for food stamps, Medicaid and utility assistance. 360-396-4009 ? [facility]: Transportation to medical appointments. (734)060-1950 ? Social Security Administration: May apply for disability if have a Stage IV cancer. 925-419-0594 (365)309-1871 ? [employer], Disability and Transit Services: Assists with nutrition, care and transit needs. [place] Support Programs: @10RELATIVEDAYS @ > Cancer Support Group  2nd Tuesday of the month 1pm-2pm, Journey Room  > Creative Journey  3rd Tuesday of the month 1130am-1pm, Journey Room  > Look Good Feel Better  1st Wednesday of the month 10am-12 noon, Journey Room (Call [place] to register [phone])

## 2017-03-07 ENCOUNTER — Encounter (HOSPITAL_COMMUNITY): Payer: Self-pay

## 2017-03-07 ENCOUNTER — Ambulatory Visit (HOSPITAL_COMMUNITY)
Admission: RE | Admit: 2017-03-07 | Discharge: 2017-03-07 | Disposition: A | Discharge status: Home / Self Care | Payer: No Typology Code available for payment source | Source: Ambulatory Visit | Attending: Oncology | Admitting: Oncology

## 2017-03-07 ENCOUNTER — Other Ambulatory Visit (HOSPITAL_COMMUNITY): Payer: Self-pay | Admitting: Oncology

## 2017-03-07 ENCOUNTER — Ambulatory Visit (HOSPITAL_COMMUNITY): Admission: RE | Admit: 2017-03-07 | Payer: Self-pay | Source: Ambulatory Visit

## 2017-03-07 DIAGNOSIS — Z87891 Personal history of nicotine dependence: Secondary | ICD-10-CM | POA: Insufficient documentation

## 2017-03-07 DIAGNOSIS — C159 Malignant neoplasm of esophagus, unspecified: Secondary | ICD-10-CM

## 2017-03-07 DIAGNOSIS — F319 Bipolar disorder, unspecified: Secondary | ICD-10-CM | POA: Insufficient documentation

## 2017-03-07 DIAGNOSIS — C155 Malignant neoplasm of lower third of esophagus: Secondary | ICD-10-CM

## 2017-03-07 DIAGNOSIS — K219 Gastro-esophageal reflux disease without esophagitis: Secondary | ICD-10-CM | POA: Insufficient documentation

## 2017-03-07 DIAGNOSIS — F419 Anxiety disorder, unspecified: Secondary | ICD-10-CM | POA: Insufficient documentation

## 2017-03-07 HISTORY — PX: IR GASTROSTOMY TUBE MOD SED: IMG625

## 2017-03-07 HISTORY — PX: IR FLUORO GUIDE PORT INSERTION RIGHT: IMG5741

## 2017-03-07 HISTORY — PX: IR US GUIDE VASC ACCESS RIGHT: IMG2390

## 2017-03-07 LAB — CBC
HCT: 29.2 % — ABNORMAL LOW (ref 39.0–52.0)
Hemoglobin: 9.4 g/dL — ABNORMAL LOW (ref 13.0–17.0)
MCH: 26.7 pg (ref 26.0–34.0)
MCHC: 32.2 g/dL (ref 30.0–36.0)
MCV: 83 fL (ref 78.0–100.0)
PLATELETS: 375 10*3/uL (ref 150–400)
RBC: 3.52 MIL/uL — ABNORMAL LOW (ref 4.22–5.81)
RDW: 14.5 % (ref 11.5–15.5)
WBC: 8.1 10*3/uL (ref 4.0–10.5)

## 2017-03-07 LAB — PROTIME-INR
INR: 1.02
Prothrombin Time: 13.4 seconds (ref 11.4–15.2)

## 2017-03-07 LAB — APTT: aPTT: 34 seconds (ref 24–36)

## 2017-03-07 MED ORDER — HYDROCODONE-ACETAMINOPHEN 7.5-325 MG/15ML PO SOLN
10.0000 mL | ORAL | Status: DC | PRN
  Administered 2017-03-07: 10 mL via ORAL
  Filled 2017-03-07: qty 15

## 2017-03-07 MED ORDER — LIDOCAINE VISCOUS 2 % MT SOLN
OROMUCOSAL | Status: DC | PRN
  Administered 2017-03-07: 15 mL via OROMUCOSAL

## 2017-03-07 MED ORDER — GLUCAGON HCL (RDNA) 1 MG IJ SOLR
INTRAMUSCULAR | Status: AC | PRN
  Administered 2017-03-07: 1 mg via INTRAVENOUS

## 2017-03-07 MED ORDER — MIDAZOLAM HCL 2 MG/2ML IJ SOLN
INTRAMUSCULAR | Status: AC | PRN
  Administered 2017-03-07 (×3): 1 mg via INTRAVENOUS

## 2017-03-07 MED ORDER — LIDOCAINE VISCOUS 2 % MT SOLN
OROMUCOSAL | Status: AC
  Filled 2017-03-07: qty 15

## 2017-03-07 MED ORDER — IOPAMIDOL (ISOVUE-300) INJECTION 61%
INTRAVENOUS | Status: AC
Start: 2017-03-07 — End: 2017-03-07
  Filled 2017-03-07: qty 50

## 2017-03-07 MED ORDER — HEPARIN SOD (PORK) LOCK FLUSH 100 UNIT/ML IV SOLN
INTRAVENOUS | Status: AC
  Filled 2017-03-07: qty 5

## 2017-03-07 MED ORDER — HEPARIN SOD (PORK) LOCK FLUSH 100 UNIT/ML IV SOLN
INTRAVENOUS | Status: AC | PRN
  Administered 2017-03-07: 500 [IU] via INTRAVENOUS

## 2017-03-07 MED ORDER — MIDAZOLAM HCL 2 MG/2ML IJ SOLN
INTRAMUSCULAR | Status: AC
  Filled 2017-03-07: qty 6

## 2017-03-07 MED ORDER — IOPAMIDOL (ISOVUE-300) INJECTION 61%
INTRAVENOUS | Status: AC
  Administered 2017-03-07: 15 mL
  Filled 2017-03-07: qty 50

## 2017-03-07 MED ORDER — LIDOCAINE HCL (PF) 1 % IJ SOLN
INTRAMUSCULAR | Status: AC
  Filled 2017-03-07: qty 30

## 2017-03-07 MED ORDER — FENTANYL CITRATE (PF) 100 MCG/2ML IJ SOLN
INTRAMUSCULAR | Status: AC | PRN
  Administered 2017-03-07 (×2): 25 ug via INTRAVENOUS
  Administered 2017-03-07: 50 ug via INTRAVENOUS

## 2017-03-07 MED ORDER — FENTANYL CITRATE (PF) 100 MCG/2ML IJ SOLN
INTRAMUSCULAR | Status: AC
  Filled 2017-03-07: qty 6

## 2017-03-07 MED ORDER — LORAZEPAM 2 MG/ML IJ SOLN: INTRAMUSCULAR | Status: AC | PRN

## 2017-03-07 MED ORDER — CEFAZOLIN SODIUM-DEXTROSE 2-4 GM/100ML-% IV SOLN
2.0000 g | Freq: Once | INTRAVENOUS | Status: AC
  Administered 2017-03-07: 2 g via INTRAVENOUS

## 2017-03-07 MED ORDER — SODIUM CHLORIDE 0.9 % IV SOLN
INTRAVENOUS | Status: DC
  Administered 2017-03-07: 10 mL/h via INTRAVENOUS

## 2017-03-07 MED ORDER — CEFAZOLIN SODIUM-DEXTROSE 2-4 GM/100ML-% IV SOLN
INTRAVENOUS | Status: AC
  Administered 2017-03-07: 2 g via INTRAVENOUS
  Filled 2017-03-07: qty 100

## 2017-03-07 MED ORDER — LIDOCAINE HCL (PF) 1 % IJ SOLN
INTRAMUSCULAR | Status: DC | PRN
  Administered 2017-03-07: 20 mL

## 2017-03-07 MED ORDER — GLUCAGON HCL RDNA (DIAGNOSTIC) 1 MG IJ SOLR
INTRAMUSCULAR | Status: AC
  Filled 2017-03-07: qty 1

## 2017-03-07 MED ORDER — MIDAZOLAM HCL 2 MG/2ML IJ SOLN
INTRAMUSCULAR | Status: AC | PRN
  Administered 2017-03-07 (×2): 1 mg via INTRAVENOUS

## 2017-03-07 MED ORDER — LIDOCAINE HCL (PF) 1 % IJ SOLN
INTRAMUSCULAR | Status: AC | PRN
  Administered 2017-03-07: 20 mL

## 2017-03-07 MED ORDER — FENTANYL CITRATE (PF) 100 MCG/2ML IJ SOLN
INTRAMUSCULAR | Status: AC | PRN
  Administered 2017-03-07 (×4): 50 ug via INTRAVENOUS

## 2017-03-07 NOTE — Procedures (Signed)
Successful placement of right jugular portacath, tip at SVC/RA junction. Successful placement of 18 Fr gastrostomy tube placement.  Minimal blood loss  No immediate complication.

## 2017-03-07 NOTE — Sedation Documentation (Signed)
Doctor staying in room for IR team getting patient ready for g-tube.

## 2017-03-07 NOTE — Sedation Documentation (Signed)
Patient is resting comfortably. 

## 2017-03-07 NOTE — Sedation Documentation (Signed)
PA at bedside in IR holding

## 2017-03-07 NOTE — Sedation Documentation (Signed)
Patient denies pain and is resting comfortably.  

## 2017-03-07 NOTE — Progress Notes (Signed)
Hycet 28ml wasted in sink

## 2017-03-07 NOTE — Progress Notes (Signed)
Hycet 5 ml wasted in sink.  Witnessed by Flavia Shipper, RN

## 2017-03-07 NOTE — Sedation Documentation (Signed)
IR staff need to start sterile process of over.

## 2017-03-07 NOTE — H&P (Signed)
Chief Complaint: esophageal cancer  Referring Physician:Dr. Twana First  Supervising Physician: Markus Daft  Patient Status: [facility] - Out-pt  HPI: Kurt Fisher is a 56 y.o. male  Who was just seen last Thursday for [facility] and G-tube insertion however, he was given the wrong contrast to drink for the g-tube and the procedure had to be canceled.  He has esophageal cancer and will start treatment soon.  Please see recent H&P for further details.  No change to his history since last week.  Past Medical History:  Past Medical History:  Diagnosis Date  . Anxiety   . Bipolar 1 disorder ([place])   . Depression   . Esophageal cancer ([place]) 02/16/2017  . GERD (gastroesophageal reflux disease)     Past Surgical History:  Past Surgical History:  Procedure Laterality Date  . BIOPSY  02/09/2017   Procedure: BIOPSY;  Surgeon: Rogene Houston, MD;  Location: [facility];  Service: Endoscopy;;  gastric/GE Junction/distal esophagus bx   . COLONOSCOPY N/A 02/09/2017   Procedure: COLONOSCOPY;  Surgeon: Rogene Houston, MD;  Location: [facility];  Service: Endoscopy;  Laterality: N/A;  . ESOPHAGOGASTRODUODENOSCOPY N/A 02/09/2017   Procedure: ESOPHAGOGASTRODUODENOSCOPY (EGD);  Surgeon: Rogene Houston, MD;  Location: [facility];  Service: Endoscopy;  Laterality: N/A;  2:40-moved up to 1255 per Lelon Frohlich    Family History:  Family History  Problem Relation Age of Onset  . Asthma Daughter   . Cancer Mother 44       lung  . Cancer Father   . Diabetes Father   . Diabetes Brother   . Heart attack Brother   . Cancer Paternal Grandmother   . Asthma Daughter   . Fibromyalgia Daughter   . Migraines Daughter   . Anxiety disorder Daughter   . Mood Disorder Daughter   . Panic disorder Daughter   . OCD Daughter     Social History:  reports that he quit smoking about 14 years ago. His smoking use included Cigarettes. He has never used smokeless tobacco. He reports that he uses drugs, including  Marijuana, about 7 times per week. He reports that he does not drink alcohol.  Allergies: No Known Allergies  Medications: Medications reviewed in epic  Please HPI for pertinent positives, otherwise complete 10 system ROS negative.  Mallampati Score: MD Evaluation Airway: WNL Heart: WNL Abdomen: WNL Chest/ Lungs: WNL ASA  Classification: 2 Mallampati/Airway Score: Two  Physical Exam: BP 106/68 (BP Location: Right Arm)   Pulse 84   Temp 98 F (36.7 C)   Resp 16   Ht 6\' 4"  (1.93 m)   Wt 153 lb (69.4 kg)   SpO2 100%   BMI 18.62 kg/m  Body mass index is 18.62 kg/m. General: pleasant, skinny white male who is laying in bed in NAD HEENT: head is normocephalic, atraumatic.  Sclera are noninjected.  PERRL.  Ears and nose without any masses or lesions.  Mouth is pink and moist Heart: regular, rate, and rhythm.  Normal s1,s2. No obvious murmurs, gallops, or rubs noted.  Palpable radial pulses bilaterally Lungs: CTAB, no wheezes, rhonchi, or rales noted.  Respiratory effort nonlabored Abd: soft, NT, ND, +BS, no masses, hernias, or organomegaly Psych: A&Ox3 with an appropriate affect.   Labs: Results for orders placed or performed during the hospital encounter of 03/07/17 (from the past 48 hour(s))  APTT upon arrival     Status: None   Collection Time: 03/07/17  7:27 AM  Result  Value Ref Range   aPTT 34 24 - 36 seconds  CBC upon arrival     Status: Abnormal   Collection Time: 03/07/17  7:27 AM  Result Value Ref Range   WBC 8.1 4.0 - 10.5 K/uL   RBC 3.52 (L) 4.22 - 5.81 MIL/uL   Hemoglobin 9.4 (L) 13.0 - 17.0 g/dL   HCT 29.2 (L) 39.0 - 52.0 %   MCV 83.0 78.0 - 100.0 fL   MCH 26.7 26.0 - 34.0 pg   MCHC 32.2 30.0 - 36.0 g/dL   RDW 14.5 11.5 - 15.5 %   Platelets 375 150 - 400 K/uL  Protime-INR upon arrival     Status: None   Collection Time: 03/07/17  7:27 AM  Result Value Ref Range   Prothrombin Time 13.4 11.4 - 15.2 seconds   INR 1.02     Imaging: No results  found.  Assessment/Plan 1. Esophageal cancer  We will plan to proceed with PAC and G-tube placement today.  We will likely do a push technique given his large esophageal mass.  He did drink his barium last night around 2100.  We will also proceed with PAC placement as well.  His labs have been reviewed along with his vitals. Risks and benefits discussed with the patient including, but not limited to bleeding, infection, pneumothorax, or fibrin sheath development and need for additional procedures. All of the patient's questions were answered, patient is agreeable to proceed. Consent signed and in chart. Risks and benefits discussed with the patient including, but not limited to the need for a barium enema during the procedure, bleeding, infection, peritonitis, or damage to adjacent structures. All of the patient's questions were answered, patient is agreeable to proceed. Consent signed and in chart.    Thank you for this interesting consult.  I greatly enjoyed meeting Kurt Fisher and look forward to participating in their care.  A copy of this report was sent to the requesting provider on this date.  Electronically Signed: Henreitta Cea 03/07/2017, 9:35 AM   I spent a total of    25 Minutes in face to face in clinical consultation, greater than 50% of which was counseling/coordinating care for esophageal cancer

## 2017-03-07 NOTE — Sedation Documentation (Signed)
Patient is resting comfortably. Patient sleeping and intermittently scratch nose or stomach. Staff instructing patient not to move states okay.

## 2017-03-07 NOTE — Discharge Instructions (Addendum)
Gastrostomy Tube Home Guide, Adult A gastrostomy tube is a tube that is surgically placed into the stomach. It is also called a G-tube. G-tubes are used when a person is unable to eat and drink enough on their own to stay healthy. The tube is inserted into the stomach through a small cut (incision) in the skin. This tube is used for:  Feeding.  Giving medication.  Gastrostomy tube care  Wash your hands with soap and water.  Remove the old dressing (if any). Some styles of G-tubes may need a dressing inserted between the skin and the G-tube. Other types of G-tubes do not require a dressing. Ask your health care provider if a dressing is needed.  Check the area where the tube enters the skin (insertion site) for redness, swelling, or pus-like (purulent) drainage. A small amount of clear or tan liquid drainage is normal. Check to make sure scar tissue (skin) is not growing around the insertion site. This could have a raised, bumpy appearance.  A cotton swab can be used to clean the skin around the tube: ? When the G-tube is first put in, a normal saline solution or water can be used to clean the skin. ? Mild soap and warm water can be used when the skin around the G-tube site has healed. ? Roll the cotton swab around the G-tube insertion site to remove any drainage or crusting at the insertion site. Stomach residuals Feeding tube residuals are the amount of liquids that are in the stomach at any given time. Residuals may be checked before giving feedings, medications, or as instructed by your health care provider.  Ask your health care provider if there are instances when you would not start tube feedings depending on the amount or type of contents withdrawn from the stomach.  Check residuals by attaching a syringe to the G-tube and pulling back on the syringe plunger. Note the amount, and return the residual back into the stomach.  Flushing the G-tube  The G-tube should be periodically  flushed with clean warm water to keep it from clogging. ? Flush the G-tube after feedings or medications. Draw up 30 mL of warm water in a syringe. Connect the syringe to the G-tube and slowly push the water into the tube. ? Do not push feedings, medications, or flushes rapidly. Flush the G-tube gently and slowly. ? Only use syringes made for G-tubes to flush medications or feedings. ? Your health care provider may want the G-tube flushed more often or with more water. If this is the case, follow your health care provider's instructions. Feedings Your health care provider will determine whether feedings are given as a bolus (a certain amount given at one time and at scheduled times) or whether feedings will be given continuously on a feeding pump.  Formulas should be given at room temperature.  If feedings are continuous, no more than 4 hours worth of feedings should be placed in the feeding bag. This helps prevent spoilage or accidental excess infusion.  Cover and place unused formula in the refrigerator.  If feedings are continuous, stop the feedings when medications or flushes are given. Be sure to restart the feedings.  Feeding bags and syringes should be replaced as instructed by your health care provider.  Giving medication  In general, it is best if all medications are in a liquid form for G-tube administration. Liquid medications are less likely to clog the G-tube. ? Mix the liquid medication with 30 mL (or amount recommended  by your health care provider) of warm water. ? Draw up the medication into the syringe. ? Attach the syringe to the G-tube and slowly push the mixture into the G-tube. ? After giving the medication, draw up 30 mL of warm water in the syringe and slowly flush the G-tube.  For pills or capsules, check with your health care provider first before crushing medications. Some pills are not effective if they are crushed. Some capsules are sustained-release  medications. ? If appropriate, crush the pill or capsule and mix with 30 mL of warm water. Using the syringe, slowly push the medication through the tube, then flush the tube with another 30 mL of tap water. G-tube problems G-tube was pulled out.  Cause: May have been pulled out accidentally.  Solutions: Cover the opening with clean dressing and tape. Call your health care provider right away. The G-tube should be put in as soon as possible (within 4 hours) so the G-tube opening (tract) does not close. The G-tube needs to be put in at a health care setting. An X-ray needs to be done to confirm placement before the G-tube can be used again.  Redness, irritation, soreness, or foul odor around the gastrostomy site.  Cause: May be caused by leakage or infection.  Solutions: Call your health care provider right away.  Large amount of leakage of fluid or mucus-like liquid present (a large amount means it soaks clothing).  Cause: Many reasons could cause the G-tube to leak.  Solutions: Call your health care provider to discuss the amount of leakage.  Skin or scar tissue appears to be growing where tube enters skin.  Cause: Tissue growth may develop around the insertion site if the G-tube is moved or pulled on excessively.  Solutions: Secure tube with tape so that excess movement does not occur. Call your health care provider.  G-tube is clogged.  Cause: Thick formula or medication.  Solutions: Try to slowly push warm water into the tube with a large syringe. Never try to push any object into the tube to unclog it. Do not force fluid into the G-tube. If you are unable to unclog the tube, call your health care provider right away.  Tips  Head of bed (HOB) position refers to the upright position of a person's upper body. ? When giving medications or a feeding bolus, keep the [facility] up as told by your health care provider. Do this during the feeding and for 1 hour after the feeding or  medication administration. ? If continuous feedings are being given, it is best to keep the [facility] up as told by your health care provider. When ADLs (activities of daily living) are performed and the [facility] needs to be flat, be sure to turn the feeding pump off. Restart the feeding pump when the [facility] is returned to the recommended height.  Do not pull or put tension on the tube.  To prevent fluid backflow, kink the G-tube before removing the cap or disconnecting a syringe.  Check the G-tube length every day. Measure from the insertion site to the end of the G-tube. If the length is longer than previous measurements, the tube may be coming out. Call your health care provider if you notice increasing G-tube length.  Oral care, such as brushing teeth, must be continued.  You may need to remove excess air (vent) from the G-tube. Your health care provider will tell you if this is needed.  Always call your health care provider if you have  questions or problems with the G-tube. Get help right away if:  You have severe abdominal pain, tenderness, or abdominal bloating (distension).  You have nausea or vomiting.  You are constipated or have problems moving your bowels.  The G-tube insertion site is red, swollen, has a foul smell, or has yellow or brown drainage.  You have difficulty breathing or shortness of breath.  You have a fever.  You have a large amount of feeding tube residuals.  The G-tube is clogged and cannot be flushed. This information is not intended to replace advice given to you by your health care provider. Make sure you discuss any questions you have with your health care provider. Document Released: 09/20/2001 Document Revised: 12/18/2015 Document Reviewed: 03/19/2013 Elsevier Interactive Patient Education  2017 [employer] PEG Tube Home Guide A percutaneous endoscopic gastrostomy (PEG) tube is used to deliver food and fluids directly into the stomach. The tube has a clamp, a  cap, and two anchors (bolsters). One bolster keeps the tube from coming out of the stomach. The other bolster holds the tube against the abdomen. You will be taught how to use and adjust your PEG tube before you leave the hospital. You will also be taught how to care for the opening (stoma) in your abdomen. Make sure that you understand:  How to care for your PEG tube.  How to care for your stoma.  How to give yourself feedings and medicines.  When to call your health care provider for help.  How do I care for my peg tube? Check your PEG tube every day. Make sure:  It is not too tight. The bolster should rest gently over the stoma.  It is in the right position. There is a mark on the tube that shows when it is in the right position. Adjust the tube if you need to.  How do I care for my stoma? Clean your stoma every day. Follow these steps: 1. Wash your hands with soap and water. 2. Check your stoma for redness, leaking, or skin irritation. 3. Wash the stoma gently with warm, soapy water. 4. Rinse the stoma with warm water. 5. Pat the stoma area dry. You may place a gauze pad over the opening between the outer bolster and your stoma.  How do I give myself nutritional formula? Your health care provider will give you instructions about:  How much nutrition and fluid you will need for each feeding.  How often to have a feeding.  Whether to take medicine in the tube by itself or with a feeding. 1.   To give yourself a feeding, follow these steps: 1. Lay out all of the equipment that you will need. 2. Make sure that the nutritional formula is at room temperature. 3. Wash your hands with soap and water. 4. Position yourself so that you are upright. You will need to stay upright throughout the feeding and for at least 30 minutes after the feeding. 5. Make sure the syringe plunger is pushed in. Place the tip of the syringe in clear water, and slowly pull the plunger to bring (draw up)  the water into the syringe. 6. Remove the clamp and the cap from the PEG tube. 7. Push the water out of the syringe to clean (flush) the tube. 8. If the tube is clear, draw up the formula into the syringe. Make sure to use the right amount for each feeding and add water if necessary. 9. Slowly push the formula from the  syringe through the tube. 10. After the feeding, flush the tube with water. 11. Put the clamp and the cap on the tube. 1.   How do I give myself medicine? To give yourself medicine, follow these steps: 1. Lay out all of the equipment that you will need. 2. If your medicine is in tablet form, crush the tablet and dissolve it in water. 3. Wash your hands with soap and water. 4. Position yourself so that you are upright. You will need to stay upright while you give yourself medicine and for at least 30 minutes afterward. 5. Make sure the syringe plunger is pushed in. Place the tip of the syringe in clear water, and slowly pull the plunger to bring (draw up) the water into the syringe. 6. Remove the clamp and the cap from the PEG tube. 7. Push the water out of the syringe to clean (flush) the tube. 8. If the tube is clear, draw up the medicine into the syringe. 9. Slowly push the medicine from the syringe through the tube. 1.  10. Flush the tube with water. 11. Put the clamp and the cap on the tube.  You should not take sustained release (SR) medicines through your tube. If you are unsure if your medicine is a SR medicine, ask your health care provider or pharmacist. Contact a health care provider if:  You have soreness, redness, or irritation around your stoma.  You have abdominal pain or bloating during or after your feedings.  You have nausea, constipation, or diarrhea that will not go away.  You have a fever.  You have problems with your PEG tube. Get help right away if:  Your tube is blocked.  Your tube falls out.  You have pain around your tube.  You are  bleeding from your tube.  Your tube is leaking.  You choke or you have trouble breathing during or after a feeding. This information is not intended to replace advice given to you by your health care provider. Make sure you discuss any questions you have with your health care provider. Document Released: 11/26/2014 Document Revised: 12/15/2015 Document Reviewed: 07/17/2014 Elsevier Interactive Patient Education  2018 [place] An implanted port is a type of central line that is placed under the skin. Central lines are used to provide IV access when treatment or nutrition needs to be given through a persons veins. Implanted ports are used for long-term IV access. An implanted port may be placed because:  You need IV medicine that would be irritating to the small veins in your hands or arms.  You need long-term IV medicines, such as antibiotics.  You need IV nutrition for a long period.  You need frequent blood draws for lab tests.  You need dialysis.  Implanted ports are usually placed in the chest area, but they can also be placed in the upper arm, the abdomen, or the leg. An implanted port has two main parts:  Reservoir. The reservoir is round and will appear as a small, raised area under your skin. The reservoir is the part where a needle is inserted to give medicines or draw blood.  Catheter. The catheter is a thin, flexible tube that extends from the reservoir. The catheter is placed into a large vein. Medicine that is inserted into the reservoir goes into the catheter and then into the vein.  How will I care for my incision site? Do not get the incision site wet. Bathe or shower  as directed by your health care provider. How is my port accessed? Special steps must be taken to access the port:  Before the port is accessed, a numbing cream can be placed on the skin. This helps numb the skin over the port site.  Your health care provider uses a  sterile technique to access the port. ? Your health care provider must put on a mask and sterile gloves. ? The skin over your port is cleaned carefully with an antiseptic and allowed to dry. ? The port is gently pinched between sterile gloves, and a needle is inserted into the port.  Only "non-coring" port needles should be used to access the port. Once the port is accessed, a blood return should be checked. This helps ensure that the port is in the vein and is not clogged.  If your port needs to remain accessed for a constant infusion, a clear (transparent) bandage will be placed over the needle site. The bandage and needle will need to be changed every week, or as directed by your health care provider.  Keep the bandage covering the needle clean and dry. Do not get it wet. Follow your health care providers instructions on how to take a shower or bath while the port is accessed.  If your port does not need to stay accessed, no bandage is needed over the port.  What is flushing? Flushing helps keep the port from getting clogged. Follow your health care providers instructions on how and when to flush the port. Ports are usually flushed with saline solution or a medicine called heparin. The need for flushing will depend on how the port is used.  If the port is used for intermittent medicines or blood draws, the port will need to be flushed: ? After medicines have been given. ? After blood has been drawn. ? As part of routine maintenance.  If a constant infusion is running, the port may not need to be flushed.  How long will my port stay implanted? The port can stay in for as long as your health care provider thinks it is needed. When it is time for the port to come out, surgery will be done to remove it. The procedure is similar to the one performed when the port was put in. When should I seek immediate medical care? When you have an implanted port, you should seek immediate medical care  if:  You notice a bad smell coming from the incision site.  You have swelling, redness, or drainage at the incision site.  You have more swelling or pain at the port site or the surrounding area.  You have a fever that is not controlled with medicine.  This information is not intended to replace advice given to you by your health care provider. Make sure you discuss any questions you have with your health care provider. Document Released: 07/12/2005 Document Revised: 12/18/2015 Document Reviewed: 03/19/2013 Elsevier Interactive Patient Education  2017 [place]. Moderate Conscious Sedation, Adult, Care After These instructions provide you with information about caring for yourself after your procedure. Your health care provider may also give you more specific instructions. Your treatment has been planned according to current medical practices, but problems sometimes occur. Call your health care provider if you have any problems or questions after your procedure. What can I expect after the procedure? After your procedure, it is common:  To feel sleepy for several hours.  To feel clumsy and have poor balance for several hours.  To have poor judgment for several hours.  To vomit if you eat too soon.  Follow these instructions at home: For at least 24 hours after the procedure:   Do not: ? Participate in activities where you could fall or become injured. ? Drive. ? Use heavy machinery. ? Drink alcohol. ? Take sleeping pills or medicines that cause drowsiness. ? Make important decisions or sign legal documents. ? Take care of children on your own.  Rest. Eating and drinking  Follow the diet recommended by your health care provider.  If you vomit: ? Drink water, juice, or soup when you can drink without vomiting. ? Make sure you have little or no nausea before eating solid foods. General instructions  Have a responsible adult stay with you until you are awake and  alert.  Take over-the-counter and prescription medicines only as told by your health care provider.  If you smoke, do not smoke without supervision.  Keep all follow-up visits as told by your health care provider. This is important. Contact a health care provider if:  You keep feeling nauseous or you keep vomiting.  You feel light-headed.  You develop a rash.  You have a fever. Get help right away if:  You have trouble breathing. This information is not intended to replace advice given to you by your health care provider. Make sure you discuss any questions you have with your health care provider. Document Released: 05/02/2013 Document Revised: 12/15/2015 Document Reviewed: 11/01/2015 Elsevier Interactive Patient Education  [employer].

## 2017-03-07 NOTE — Discharge Instructions (Signed)
Care of a Feeding Tube Feeding tubes are often given to those who have trouble swallowing or cannot take food or medicine. A feeding tube can:  Go into the nose and down to the stomach.  Go through the skin in the belly (abdomen) and into the stomach or small bowel.  Supplies needed to care for the tube site:  Clean gloves.  Clean wash cloth, gauze pads, or soft paper towel.  Cotton swabs.  Skin barrier ointment or cream.  Soap and water.  Precut foam pads or gauze (that go around the tube).  Tube tape. Tube site care 1. Have all supplies ready. 2. Wash hands well. 3. Put on clean gloves. 4. Remove dirty foam pads or gauze near the tube site, if present. 5. Check the skin around the tube site for redness, rash, puffiness (swelling), leaking fluid, or extra tissue growth. Call your doctor if you see any of these. 6. Wet the gauze and cotton swabs with water and soap. 7. Wipe the area closest to the tube with cotton swabs. Wipe the surrounding skin with moistened gauze. Rinse with water. 8. Dry the skin and tube site with a dry gauze pad or soft paper towel. Do not use antibiotic ointments at the tube site. 9. If the skin is red, apply petroleum jelly in a circular motion, using a cotton swab. Your doctor may suggest a different cream or ointment. Use what the doctor suggests. 10. Apply a new pre-cut foam pad or gauze around the tube. Tape the edges down. Foam pads or gauze may be left off if there is no fluid at the tube site. 11. Use tape or a device that will attach your feeding tube to your skin or do as directed. Rotate where you tape the tube. 12. Sit the person up. 13. Throw away used supplies. 14. Remove gloves. 15. Wash hands. Supplies needed to flush a feeding tube:  Clean gloves.  60 mL syringe (that connects to feeding tube).  Towel.  Water. Flushing a feeding tube 1. Have all supplies ready. 2. Wash hands well. 3. Put on clean gloves. 4. Pull 30 mL of  water into the syringe. 5. Bend (kink) the feeding tube while disconnecting it from the feeding-bag tubing or while removing the plug at the end of the tube. 6. Insert the tip of the syringe into the end of the feeding tube. Stop bending the tube. Slowly inject the water. 7. If you cannot inject the water, the person with the feeding tube should lay on their left side. ? Do not use a syringe smaller than 60 mL to flush the tubing. ? Do not inject the water with force. 8. After injecting the water, remove the syringe. 9. Always flush the tube before giving the first medicine, between medicines, and after the final medicine before starting a feeding. ? Do not mix medicines with liquid food (formula) before giving medicines. ? Do not mix medicines with other medicines before giving medicines. ? Completely flush medicines through the tube so they do not mix with the liquid food. 10. Throw away used supplies. 11. Remove gloves. 12. Wash hands. This information is not intended to replace advice given to you by your health care provider. Make sure you discuss any questions you have with your health care provider. Document Released: 04/05/2012 Document Revised: 12/18/2015 Document Reviewed: 02/24/2012 Elsevier Interactive Patient Education  2017 [employer] Insert[employer], Care After This sheet gives you information about how to care for yourself  after your procedure. Your health care provider may also give you more specific instructions. If you have problems or questions, contact your health care provider. What can I expect after the procedure? After your procedure, it is common to have:  Discomfort at the port insertion site.  Bruising on the skin over the port. This should improve over 3-4 days.  Follow these instructions at home: [facility] care  After your port is placed, you will get a manufacturer's information card. The card has information about your port. Keep this card with you  at all times.  Take care of the port as told by your health care provider. Ask your health care provider if you or a family member can get training for taking care of the port at home. A home health care nurse may also take care of the port.  Make sure to remember what type of port you have. Incision care  Follow instructions from your health care provider about how to take care of your port insertion site. Make sure you: ? Wash your hands with soap and water before you change your bandage (dressing). If soap and water are not available, use hand sanitizer. ? Change your dressing as told by your health care provider. ? Leave stitches (sutures), skin glue, or adhesive strips in place. These skin closures may need to stay in place for 2 weeks or longer. If adhesive strip edges start to loosen and curl up, you may trim the loose edges. Do not remove adhesive strips completely unless your health care provider tells you to do that.  Check your port insertion site every day for signs of infection. Check for: ? More redness, swelling, or pain. ? More fluid or blood. ? Warmth. ? Pus or a bad smell. General instructions  Do not take baths, swim, or use a hot tub until your health care provider approves.  Do not lift anything that is heavier than 10 lb (4.5 kg) for a week, or as told by your health care provider.  Ask your health care provider when it is okay to: ? Return to work or school. ? Resume usual physical activities or sports.  Do not drive for 24 hours if you were given a medicine to help you relax (sedative).  Take over-the-counter and prescription medicines only as told by your health care provider.  Wear a medical alert bracelet in case of an emergency. This will tell any health care providers that you have a port.  Keep all follow-up visits as told by your health care provider. This is important. Contact a health care provider if:  You cannot flush your port with saline as  directed, or you cannot draw blood from the port.  You have a fever or chills.  You have more redness, swelling, or pain around your port insertion site.  You have more fluid or blood coming from your port insertion site.  Your port insertion site feels warm to the touch.  You have pus or a bad smell coming from the port insertion site. Get help right away if:  You have chest pain or shortness of breath.  You have bleeding from your port that you cannot control. Summary  Take care of the port as told by your health care provider.  Change your dressing as told by your health care provider.  Keep all follow-up visits as told by your health care provider. This information is not intended to replace advice given to you by your health care  provider. Make sure you discuss any questions you have with your health care provider. Document Released: 05/02/2013 Document Revised: 06/02/2016 Document Reviewed: 06/02/2016 Elsevier Interactive Patient Education  2017 [employer].

## 2017-03-08 ENCOUNTER — Encounter: Payer: Self-pay | Admitting: Radiation Oncology

## 2017-03-08 ENCOUNTER — Ambulatory Visit
Admission: RE | Admit: 2017-03-08 | Discharge: 2017-03-08 | Disposition: A | Discharge status: Home / Self Care | Payer: Medicare Other | Source: Ambulatory Visit | Attending: Radiation Oncology | Admitting: Radiation Oncology

## 2017-03-08 VITALS — BP 87/55 | HR 93 | Temp 98.1°F | Resp 20 | Ht 76.0 in | Wt 156.4 lb

## 2017-03-08 DIAGNOSIS — Z818 Family history of other mental and behavioral disorders: Secondary | ICD-10-CM | POA: Insufficient documentation

## 2017-03-08 DIAGNOSIS — N400 Enlarged prostate without lower urinary tract symptoms: Secondary | ICD-10-CM | POA: Insufficient documentation

## 2017-03-08 DIAGNOSIS — Z931 Gastrostomy status: Secondary | ICD-10-CM | POA: Insufficient documentation

## 2017-03-08 DIAGNOSIS — Z681 Body mass index (BMI) 19 or less, adult: Secondary | ICD-10-CM | POA: Insufficient documentation

## 2017-03-08 DIAGNOSIS — Z833 Family history of diabetes mellitus: Secondary | ICD-10-CM | POA: Insufficient documentation

## 2017-03-08 DIAGNOSIS — C154 Malignant neoplasm of middle third of esophagus: Secondary | ICD-10-CM

## 2017-03-08 DIAGNOSIS — R634 Abnormal weight loss: Secondary | ICD-10-CM | POA: Insufficient documentation

## 2017-03-08 DIAGNOSIS — K219 Gastro-esophageal reflux disease without esophagitis: Secondary | ICD-10-CM | POA: Insufficient documentation

## 2017-03-08 DIAGNOSIS — R591 Generalized enlarged lymph nodes: Secondary | ICD-10-CM | POA: Insufficient documentation

## 2017-03-08 DIAGNOSIS — F319 Bipolar disorder, unspecified: Secondary | ICD-10-CM | POA: Insufficient documentation

## 2017-03-08 DIAGNOSIS — Z510 Encounter for antineoplastic radiation therapy: Secondary | ICD-10-CM | POA: Insufficient documentation

## 2017-03-08 DIAGNOSIS — Z79899 Other long term (current) drug therapy: Secondary | ICD-10-CM | POA: Insufficient documentation

## 2017-03-08 DIAGNOSIS — Z825 Family history of asthma and other chronic lower respiratory diseases: Secondary | ICD-10-CM | POA: Insufficient documentation

## 2017-03-08 DIAGNOSIS — R1310 Dysphagia, unspecified: Secondary | ICD-10-CM | POA: Insufficient documentation

## 2017-03-08 DIAGNOSIS — Z8249 Family history of ischemic heart disease and other diseases of the circulatory system: Secondary | ICD-10-CM | POA: Insufficient documentation

## 2017-03-08 DIAGNOSIS — C155 Malignant neoplasm of lower third of esophagus: Secondary | ICD-10-CM | POA: Insufficient documentation

## 2017-03-08 DIAGNOSIS — Z801 Family history of malignant neoplasm of trachea, bronchus and lung: Secondary | ICD-10-CM | POA: Insufficient documentation

## 2017-03-08 DIAGNOSIS — F419 Anxiety disorder, unspecified: Secondary | ICD-10-CM | POA: Insufficient documentation

## 2017-03-08 DIAGNOSIS — Z87891 Personal history of nicotine dependence: Secondary | ICD-10-CM | POA: Insufficient documentation

## 2017-03-08 NOTE — Progress Notes (Signed)
Nutrition  Patient seen during MD visit with Dr. Lisbeth Renshaw.  [facility] representative has left 5 messages on patient's voicemail between 8/10 and today and he has not returned call to discuss delivery of tube feeding supplies.  Encouraged patient to call [facility] representative so supplies can be delivered.  Patient reports he has tried to call them back and phone rings and rings.  Patient does not have cell phone with him today at MD visit.   Reminded patient and wife of lab work at 11:10 at [facility] on 8/16 and importance of getting labs done.   RD will send message to scheduler once treatment plan known to schedule nutrition follow-up at [facility] during radiation.    Kurt Fisher B. Zenia Resides, [place], [place] Registered Dietitian [phone] (pager)

## 2017-03-08 NOTE — Progress Notes (Signed)
Radiation Oncology         [phone]) [phone] ________________________________  Name: Kurt Fisher        MRN: [id number]   Date of Service: 03/08/2017 DOB: [birth year]  BJ:YNWGN, Lonie Peak, PA-C  Twana First, MD     REFERRING PHYSICIAN: Twana First, MD   DIAGNOSIS: The encounter diagnosis was Malignant neoplasm of middle third of esophagus ([place]). Stage IVA (cT4b, cN2, cM0) adenocarcinoma of esophagus    HISTORY OF PRESENT ILLNESS: Kurt Fisher is a 56 y.o. male seen at the request of Dr. Talbert Cage and Dr. Jeb Levering for a newly diagnosed adenocarcinoma of the esophagus, which invades the middle of the mediastinum. He started having trouble with unintentional weight loss for several months and has lost about 35 pounds. A CT scan of the abdomen and pelvis on 01/29/17 revealed distal esophageal wall thickening. A moderate hiatal hernia was also noted. He underwent EGD and colonoscopy on 02/09/17 with Dr. Laural Golden and a circumferential stenosis of the esophagus was noted consistent with intrinsic changes. A 4 x .8 cm tumor was noted 37 to 41 cm from the incisors. A biopsy was obtained and consistent with poorly differentiated invasive adenocarcinoma of the esophagus. He had a incomplete colonoscopy prep.  The patient underwent CT of the chest on 02/15/17, that revealed a large mass involving the mid and distal esophagus that invades the middle mediastinum and encases the left mainstem  bronchus, enlarged gastrohepatic ligament node, evidence of mediastinal, bilateral hilar lymph node and hiatal hernia.The patient was prescribed zofran and compazine on 02/28/2017 for nausea and vomiting. A PET scan on 02/28/17 revealed hypermetabolic change of the GE junction seen on CT with an SUV of 28.4, with multiple hypermetabolic paraesophageal nodes not readily separable from the esophagus. The nodal involvement involves the subcarinal region, greater than 180 degrees of the left mainstem bronchus. In the right paratracheal region and  right supraclavicular fossa. There was adenopathy that was hypermetabolic along the paratracheal region measuring 1.1 cm, and a left paraesophageal node measuring 1.3 cm above the level of the tumor. A node adjacent to the gastric cardia measuring 1.4 cm, and a right periaortic lymph node at the level of the renal vein, both of which are also hypermetabolic. Faint hypermetabolic change in both adrenal glands are noted but not specific to focal change that could be physiologic. He has undergone PEG and PAC placement and comes today to discuss options of radiotherapy. He would like to receive treatment here in [place] rather than in [place].    PREVIOUS RADIATION THERAPY: No   PAST MEDICAL HISTORY:  Past Medical History:  Diagnosis Date  . Anxiety   . Bipolar 1 disorder ([place])   . Depression   . Esophageal cancer ([place]) 02/16/2017  . GERD (gastroesophageal reflux disease)        PAST SURGICAL HISTORY: Past Surgical History:  Procedure Laterality Date  . BIOPSY  02/09/2017   Procedure: BIOPSY;  Surgeon: Rogene Houston, MD;  [facility];  Service: Endoscopy;;  gastric/GE Junction/distal esophagus bx   . COLONOSCOPY N/A 02/09/2017   Procedure: COLONOSCOPY;  Surgeon: Rogene Houston, MD;  [facility];  Service: Endoscopy;  Laterality: N/A;  . ESOPHAGOGASTRODUODENOSCOPY N/A 02/09/2017   Procedure: ESOPHAGOGASTRODUODENOSCOPY (EGD);  Surgeon: Rogene Houston, MD;  [facility];  Service: Endoscopy;  Laterality: N/A;  2:40-moved up to 1255 per Lelon Frohlich  . IR FLUORO GUIDE PORT INSERTION RIGHT  03/07/2017  . IR GASTROSTOMY TUBE MOD  SED  03/07/2017  . IR US GUIDE VASC ACCESS RIGHT  03/07/2017     FAMILY HISTORY:  Family History  Problem Relation Age of Onset  . Asthma Daughter   . Cancer Mother 24       lung  . Cancer Father   . Diabetes Father   . Diabetes Brother   . Heart attack Brother   . Cancer Paternal Grandmother   . Asthma Daughter   .  Fibromyalgia Daughter   . Migraines Daughter   . Anxiety disorder Daughter   . Mood Disorder Daughter   . Panic disorder Daughter   . OCD Daughter      SOCIAL HISTORY:  reports that he quit smoking about 14 years ago. His smoking use included Cigarettes. He has never used smokeless tobacco. He reports that he uses drugs, including Marijuana, about 7 times per week. He reports that he does not drink alcohol.   ALLERGIES: Patient has no known allergies.   MEDICATIONS:  Current Outpatient Prescriptions  Medication Sig Dispense Refill  . docusate sodium (COLACE) 100 MG capsule Take 100 mg by mouth daily.    Marland Kitchen HYDROcodone-acetaminophen (HYCET) 7.5-325 mg/15 ml solution Take 10 mLs by mouth 4 (four) times daily as needed for moderate pain. 120 mL 0  . ondansetron (ZOFRAN) 8 MG tablet Take 1 tablet (8 mg total) by mouth every 8 (eight) hours as needed for nausea or vomiting. 30 tablet 2  . pantoprazole (PROTONIX) 40 MG tablet Take 1 tablet (40 mg total) by mouth 2 (two) times daily before a meal. 60 tablet 3  . PARoxetine (PAXIL) 40 MG tablet Take 40 mg by mouth every evening.     . prochlorperazine (COMPAZINE) 10 MG tablet Take 1 tablet (10 mg total) by mouth every 6 (six) hours as needed for nausea or vomiting. 30 tablet 2  . Nutritional Supplements (FEEDING SUPPLEMENT, OSMOLITE 1.5 CAL,) LIQD Give 1 1/2 cans of tube feeding via feeding tube 4 times per day.  Flush tube with 51ml of water before and after feeding.   Start with 1/2 can of tube feeding 4 times per day and increase by 1/2 can daily until goal rate of 6 cans per day reached. Send bolus tube feeding supplies. (Patient not taking: Reported on 03/07/2017) 1422 mL 0  . QUEtiapine (SEROQUEL) 300 MG tablet Take 600 mg by mouth at bedtime.      No current facility-administered medications for this encounter.      REVIEW OF SYSTEMS: On review of systems, the patient reports that he is doing well overall. He denies any chest pain,  shortness of breath, cough, fevers, chills, night sweats. He has noted about 35# of unintended weight change in the last 6 months. He denies any bowel or bladder disturbances, and denies  nausea or vomiting since taking oral antiemetics. He does complain of some soreness in his abdomen after having his PEG tube placed. He denies any new musculoskeletal or joint aches or pains. A complete review of systems is obtained and is otherwise negative.     PHYSICAL EXAM:  Wt Readings from Last 3 Encounters:  03/08/17 156 lb 6.4 oz (70.9 kg)  03/07/17 153 lb (69.4 kg)  03/04/17 150 lb 12.8 oz (68.4 kg)   Temp Readings from Last 3 Encounters:  03/08/17 98.1 F (36.7 C) (Oral)  03/07/17 97.8 F (36.6 C) (Oral)  03/07/17 98 F (36.7 C)   BP Readings from Last 3 Encounters:  03/08/17 (!) 87/55  03/07/17  130/85  03/07/17 118/87   Pulse Readings from Last 3 Encounters:  03/08/17 93  03/07/17 84  03/04/17 (!) 109   Pain Assessment Pain Score: 4  Pain Loc: Abdomen (where peg tube is)/10  In general this is a chronically ill caucasian male in no acute distress. He is alert and oriented x4 and appropriate throughout the examination. HEENT reveals that the patient is normocephalic, atraumatic. EOMs are intact. PERRLA. Skin is intact without any evidence of gross lesions. Cardiovascular exam reveals a regular rate and rhythm, no clicks rubs or murmurs are auscultated. Chest is clear to auscultation bilaterally. Lymphatic assessment is performed and does not reveal any adenopathy in the cervical, supraclavicular, axillary, or inguinal chains. Abdomen has active bowel sounds in all quadrants and is intact. The abdomen is soft, non tender, non distended. Lower extremities are negative for pretibial pitting edema, deep calf tenderness, cyanosis or clubbing.   ECOG = 1  0 - Asymptomatic (Fully active, able to carry on all predisease activities without restriction)  1 - Symptomatic but completely  ambulatory (Restricted in physically strenuous activity but ambulatory and able to carry out work of a light or sedentary nature. For example, light housework, office work)  2 - Symptomatic, <50% in bed during the day (Ambulatory and capable of all self care but unable to carry out any work activities. Up and about more than 50% of waking hours)  3 - Symptomatic, >50% in bed, but not bedbound (Capable of only limited self-care, confined to bed or chair 50% or more of waking hours)  4 - Bedbound (Completely disabled. Cannot carry on any self-care. Totally confined to bed or chair)  5 - Death   Eustace Pen MM, Creech RH, Tormey DC, et al. [phone]). "Toxicity and response criteria of the [facility]". [place] Oncol. 5 (6): 649-55    LABORATORY DATA:  Lab Results  Component Value Date   WBC 8.1 03/07/2017   HGB 9.4 (L) 03/07/2017   HCT 29.2 (L) 03/07/2017   MCV 83.0 03/07/2017   PLT 375 03/07/2017   Lab Results  Component Value Date   NA 136 03/03/2017   K 4.1 03/03/2017   CL 102 03/03/2017   CO2 28 03/03/2017   Lab Results  Component Value Date   ALT 9 (L) 02/16/2017   AST 15 02/16/2017   ALKPHOS 83 02/16/2017   BILITOT 0.1 (L) 02/16/2017      RADIOGRAPHY: Dg Abd 1 View  Result Date: 03/03/2017 CLINICAL DATA:  Esophageal neoplasm.  Concern for barium in stomach EXAM: ABDOMEN - 1 VIEW COMPARISON:  None. FINDINGS: There is a small amount of contrast in the distal small bowel region in the pelvis. There is no contrast seen in the region of the stomach. There is diffuse stool throughout the colon. There is no bowel dilatation or air-fluid level to suggest bowel obstruction. No free air. No abnormal calcifications are evident. IMPRESSION: Small amount of contrast in distal loops of small bowel in the pelvis. No gastric contrast evident. Diffuse stool in colon.  No bowel obstruction or free air evident. Electronically Signed   By: Lowella Grip III M.D.   On:  03/03/2017 14:16   Ct Chest W Contrast  Result Date: 02/15/2017 CLINICAL DATA:  Esophageal adenocarcinoma. EXAM: CT CHEST WITH CONTRAST TECHNIQUE: Multidetector CT imaging of the chest was performed during intravenous contrast administration. CONTRAST:  86mL ISOVUE-300 IOPAMIDOL (ISOVUE-300) INJECTION 61% COMPARISON:  None FINDINGS: Cardiovascular: Normal heart size.  No pericardial  effusion. Mediastinum/Nodes: The trachea appears patent and is midline. Moderate size hiatal hernia. Large esophageal mass extends from the sub- carinal region to just above the level of the hiatal hernia. This measures approximately 9 cm in length. At the sub- carinal level, the tumor extends ventrally to involve and narrow the left mainstem bronchus, image 79 of series 2. Here the mass as a Index right paratracheal node measures 1.4 cm, image 47 of series 2. Sub- carinal nodal mass measures 2.1 cm, image 84 of series 2. 1.1 cm left hilar lymph node is identified. 1.1 cm right hilar node is also noted. Within the posterior mediastinum there is a retroesophageal node which measures 1.3 cm, image 63 of series 2. Lungs/Pleura: No pleural effusion identified. Biapical scarring noted. No suspicious pulmonary nodules or masses identified. Upper Abdomen: No acute abnormality. Enlarged gastrohepatic ligament lymph node measures 1.2 cm, image 158 of series 2. Musculoskeletal: No aggressive lytic or sclerotic bone lesions. IMPRESSION: 1. There is a large mass involving the mid and distal esophagus. The mass invades the middle mediastinum and encases the left mainstem bronchus. 2. Evidence of mediastinal and bilateral hilar lymph node metastasis. Enlarged gastrohepatic ligament node is also noted. 3. Hiatal hernia. Electronically Signed   By: Kerby Moors M.D.   On: 02/15/2017 10:50   Ir Gastrostomy Tube Mod Sed  Result Date: 03/07/2017 INDICATION: 56 year old with esophageal cancer. Request for port and G-tube placement prior to  treatment. Port-A-Cath was placed immediately prior to gastrostomy tube placement. EXAM: PERCUTANEOUS GASTROSTOMY TUBE WITH FLUOROSCOPIC GUIDANCE Physician: Stephan Minister. Anselm Pancoast, MD MEDICATIONS: Antibiotic given prior to the Port-A-Cath placement. Glucagon 1 mg IV ANESTHESIA/SEDATION: Versed 3.0 mg IV; Fentanyl 200 mcg IV Moderate Sedation Time:  59 minutes The patient was continuously monitored during the procedure by the interventional radiology nurse under my direct supervision. FLUOROSCOPY TIME:  Fluoroscopy Time: 5 minutes and 42 seconds, 93.7 mGy COMPLICATIONS: None immediate. PROCEDURE: Informed consent was obtained for a percutaneous gastrostomy tube. The patient was placed on the interventional table. Fluoroscopy demonstrated oral contrast in the transverse colon. 5 French nasogastric tube was placed with fluoroscopic guidance. It was difficult to advance the catheter beyond the distal esophagus. The anterior abdomen was prepped and draped in sterile fashion. Maximal barrier sterile technique was utilized including caps, mask, sterile gowns, sterile gloves, sterile drape, hand hygiene and skin antiseptic. Stomach was inflated with air through the nasogastric tube. The skin and subcutaneous tissues were anesthetized with 1% lidocaine. Needle was directed into the stomach with fluoroscopic guidance and a T fastener was deployed. A second T fastener was placed using fluoroscopic guidance. Small incision was made between the T-fasteners. 17 gauge needle directed into the stomach with fluoroscopic guidance and contrast injection confirmed placement in the stomach. Stiff Amplatz wire was placed. The tract was dilated up to 20 [place]. A 20 French peel-away sheath was placed. A 18 French Intuit gastrostomy tube was easily advanced over the wire and through the peel-away sheath. Peel-away sheath was completely removed. The wire was removed. The balloon was inflated with 8 mL of saline. Contrast injection confirmed placement  in the stomach. Fluoroscopic images were obtained for documentation. The gastrostomy tube was flushed with normal saline. IMPRESSION: Successful fluoroscopic guided percutaneous gastrostomy tube placement. T-fasteners need to be cut in 10-14 days. Electronically Signed   By: Markus Daft M.D.   On: 03/07/2017 18:22   Nm Pet Image Initial (pi) Skull Base To Thigh  Result Date: 02/28/2017 CLINICAL DATA:  Initial treatment  strategy for esophageal cancer. EXAM: NUCLEAR MEDICINE PET SKULL BASE TO THIGH TECHNIQUE: 7.3 mCi F-18 FDG was injected intravenously. Full-ring PET imaging was performed from the skull base to thigh after the radiotracer. CT data was obtained and used for attenuation correction and anatomic localization. FASTING BLOOD GLUCOSE:  Value: 103 mg/dl COMPARISON:  Multiple exams, including CT scans from 02/14/2017 and 01/29/2017 FINDINGS: NECK No hypermetabolic lymph nodes in the neck. CHEST The large esophageal mass extends down from about the level of the carina to the gastroesophageal junction in the vicinity of a small type 1 hiatal hernia. Maximum SUV in this vicinity is 28.4. This measurement does include the multiple hypermetabolic paraesophageal lymph nodes which are not readily separable from the esophagus except in a few individual cases. These paraesophageal lymph nodes are primarily in the vicinity of the esophageal mass although extend into the subcarinal region, around greater than 180 degrees of the left mainstem bronchus, in the right paratracheal region, and in the left and perhaps right supraclavicular fossa. Index right paratracheal node measures 1.1 cm in short axis on image 67/4 and has a maximum SUV of 16.8. An upper left paraesophageal lymph node above the level of the tumor measures 1.3 cm in short axis on image 75/4 with maximum SUV 11.2. A rim calcified subcarinal lymph node is noted but is not overtly hypermetabolic. The right hilar and left infrahilar lymph nodes shown on the  prior CT chest are not significantly hypermetabolic. Mild biapical pleuroparenchymal scarring in the lungs. ABDOMEN/PELVIS A node adjacent to the gastric cardia (anulus lymphaticus cardiae) measures 1.4 cm in short axis on image 121/4, with maximum SUV 12.8. A right periaortic lymph node at the level of the renal vein measures 1.0 cm in short axis on image 135/4, with maximum SUV 13.7. Several additional small periaortic lymph nodes are present. Faintly hypermetabolic activity of both adrenal glands noted without overt adrenal mass seen. Left adrenal, and maximum SUV 6.1, right adrenal gland maximum SUV 5.2. Activity in the left hemipelvis is thought to be in the ureter. Accentuated activity in the cecum and proximal ascending colon, thought to be physiologic. Aortoiliac atherosclerotic vascular disease. Median lobe of the prostate gland indents the bladder base. SKELETON No focal hypermetabolic activity to suggest skeletal metastasis. IMPRESSION: 1. Large mass in the distal half of the thoracic esophagus, maximum SUV 28.4, compatible primary malignancy. Multiple associated metastatic lymph nodes in the mediastinum and below the diaphragm adjacent to the gastric cardia and in the retroperitoneum. 2. Faintly accentuated activity in both adrenal glands without visible mass, this activity could be physiologic but merits surveillance. 3.  Aortic Atherosclerosis (ICD10-I70.0). Electronically Signed   By: Van Clines M.D.   On: 02/28/2017 09:23   Ir US Guide Vasc Access Right  Result Date: 03/07/2017 INDICATION: 56 year old with esophageal cancer. Scheduled for Port-A-Cath and gastrostomy tube. EXAM: FLUOROSCOPIC AND ULTRASOUND GUIDED PLACEMENT OF A SUBCUTANEOUS PORT COMPARISON:  None. MEDICATIONS: Ancef 2 g; The antibiotic was administered within an appropriate time interval prior to skin puncture. ANESTHESIA/SEDATION: Versed 2.0 mg IV; Fentanyl 100 mcg IV; Moderate Sedation Time:  42 minutes The patient was  continuously monitored during the procedure by the interventional radiology nurse under my direct supervision. FLUOROSCOPY TIME:  24 seconds, 2 mGy COMPLICATIONS: None immediate. PROCEDURE: The procedure, risks, benefits, and alternatives were explained to the patient. Questions regarding the procedure were encouraged and answered. The patient understands and consents to the procedure. Patient was placed supine on the interventional table. Ultrasound confirmed  a patent right internal jugular vein. The right chest and neck were cleaned with a skin antiseptic and a sterile drape was placed. Maximal barrier sterile technique was utilized including caps, mask, sterile gowns, sterile gloves, sterile drape, hand hygiene and skin antiseptic. The right neck was anesthetized with 1% lidocaine. Small incision was made in the right neck with a blade. Micropuncture set was placed in the right internal jugular vein with ultrasound guidance. The micropuncture wire was used for measurement purposes. The right chest was anesthetized with 1% lidocaine with epinephrine. #15 blade was used to make an incision and a subcutaneous port pocket was formed. [place] was assembled. Subcutaneous tunnel was formed with a stiff tunneling device. The port catheter was brought through the subcutaneous tunnel. The port was placed in the subcutaneous pocket. The micropuncture set was exchanged for a peel-away sheath. The catheter was placed through the peel-away sheath and the tip was positioned at the superior cavoatrial junction. Catheter placement was confirmed with fluoroscopy. The port was accessed and flushed with heparinized saline. The port pocket was closed using two layers of absorbable sutures and Dermabond. The vein skin site was closed using a single layer of absorbable suture and Dermabond. Sterile dressings were applied. Patient tolerated the procedure well without an immediate complication. Ultrasound and fluoroscopic  images were taken and saved for this procedure. IMPRESSION: Placement of a subcutaneous port device. Electronically Signed   By: Markus Daft M.D.   On: 03/07/2017 18:28   Ir Fluoro Guide Port Insertion Right  Result Date: 03/07/2017 INDICATION: 56 year old with esophageal cancer. Scheduled for Port-A-Cath and gastrostomy tube. EXAM: FLUOROSCOPIC AND ULTRASOUND GUIDED PLACEMENT OF A SUBCUTANEOUS PORT COMPARISON:  None. MEDICATIONS: Ancef 2 g; The antibiotic was administered within an appropriate time interval prior to skin puncture. ANESTHESIA/SEDATION: Versed 2.0 mg IV; Fentanyl 100 mcg IV; Moderate Sedation Time:  42 minutes The patient was continuously monitored during the procedure by the interventional radiology nurse under my direct supervision. FLUOROSCOPY TIME:  24 seconds, 2 mGy COMPLICATIONS: None immediate. PROCEDURE: The procedure, risks, benefits, and alternatives were explained to the patient. Questions regarding the procedure were encouraged and answered. The patient understands and consents to the procedure. Patient was placed supine on the interventional table. Ultrasound confirmed a patent right internal jugular vein. The right chest and neck were cleaned with a skin antiseptic and a sterile drape was placed. Maximal barrier sterile technique was utilized including caps, mask, sterile gowns, sterile gloves, sterile drape, hand hygiene and skin antiseptic. The right neck was anesthetized with 1% lidocaine. Small incision was made in the right neck with a blade. Micropuncture set was placed in the right internal jugular vein with ultrasound guidance. The micropuncture wire was used for measurement purposes. The right chest was anesthetized with 1% lidocaine with epinephrine. #15 blade was used to make an incision and a subcutaneous port pocket was formed. [place] was assembled. Subcutaneous tunnel was formed with a stiff tunneling device. The port catheter was brought through the  subcutaneous tunnel. The port was placed in the subcutaneous pocket. The micropuncture set was exchanged for a peel-away sheath. The catheter was placed through the peel-away sheath and the tip was positioned at the superior cavoatrial junction. Catheter placement was confirmed with fluoroscopy. The port was accessed and flushed with heparinized saline. The port pocket was closed using two layers of absorbable sutures and Dermabond. The vein skin site was closed using a single layer of absorbable suture and Dermabond.  Sterile dressings were applied. Patient tolerated the procedure well without an immediate complication. Ultrasound and fluoroscopic images were taken and saved for this procedure. IMPRESSION: Placement of a subcutaneous port device. Electronically Signed   By: Markus Daft M.D.   On: 03/07/2017 18:28       IMPRESSION/PLAN: 1. Locally advanced adenocarcinoma of esophagus. Dr. Lisbeth Renshaw discusses the pathology findings and reviews the nature of advanced esophageal disease. Although he's been staged as a Stage IV based on TNM staging, Dr. Lisbeth Renshaw would recommend approaching the patient with curative intent as outside of the nodes and esophagus, there is not overt evidence of metastatic disease. We discussed the risks, benefits, short, and long term effects of radiotherapy, and the patient is interested in proceeding concurrently with chemotherapy. Dr. Lisbeth Renshaw discusses the delivery and logistics of radiotherapy and would recommend a course of about 6 weeks of therapy. We will plan to proceed with treatment the week of 03/21/17. He will come tomorrow for simulation at 1pm tomorrow. Written consent is obtained and placed in the chart, a copy was provided to the patient. We will discuss with Dr. Benay Spice and Dr. Burr Medico to see if they would be willing to see him for treatment here in [place]. 2. Enlargement of the prostate on imaging. The patient will proceed with evaluation with urology.  3. Weight loss  secondary to #1. The patient has lost significant weight loss and now has a PEG tube in place. He will meet with nutrition to help guide in tube feeds.   The above documentation reflects my direct findings during this shared patient visit. Please see the separate note by Dr. Lisbeth Renshaw on this date for the remainder of the patient's plan of care.    Carola Rhine, PAC  This document serves as a record of services personally performed by Shona Simpson, PA-C and Kyung Rudd, MD. It was created on their behalf by Valeta Harms, a trained medical scribe. The creation of this record is based on the scribe's personal observations and the providers' statements to them. This document has been checked and approved by the attending provider.

## 2017-03-08 NOTE — Progress Notes (Signed)
Please see the Nurse Progress Note in the MD Initial Consult Encounter for this patient. 

## 2017-03-09 ENCOUNTER — Encounter: Payer: Self-pay | Admitting: *Deleted

## 2017-03-09 ENCOUNTER — Other Ambulatory Visit (HOSPITAL_COMMUNITY): Payer: Self-pay | Admitting: Adult Health

## 2017-03-09 ENCOUNTER — Ambulatory Visit
Admission: RE | Admit: 2017-03-09 | Discharge: 2017-03-09 | Disposition: A | Discharge status: Home / Self Care | Payer: Medicare Other | Source: Ambulatory Visit | Attending: Radiation Oncology | Admitting: Radiation Oncology

## 2017-03-09 ENCOUNTER — Telehealth (HOSPITAL_COMMUNITY): Payer: Self-pay

## 2017-03-09 DIAGNOSIS — C155 Malignant neoplasm of lower third of esophagus: Secondary | ICD-10-CM

## 2017-03-09 MED ORDER — HYDROCODONE-ACETAMINOPHEN 7.5-325 MG/15ML PO SOLN: 15.0000 mL | Freq: Four times a day (QID) | ORAL | 0 refills | Status: DC | PRN

## 2017-03-09 NOTE — Telephone Encounter (Signed)
Patients ex-wife called stating patient needs a refill on his pain medicine. She states he takes the liquid hydrocodone and is having to take it every 6 hours to control the pain. She states the last MD just gave them enough for 3 days and he will run out tomorrow. I explained to patients ex-wife that I would have to review with the provider and I will be able to let her know something tomorrow. She verbalized understanding.

## 2017-03-09 NOTE — Telephone Encounter (Signed)
[place] Controlled Substance Reporting System reviewed. Patient received last refill of Norco solution on 02/28/17, #120 mLs. Previous Rx stated for him to take 10 mL QIDprn.    Refill is appropriate. New Rx printed for Norco solution 7.5/325, take 15 mL po Q6Hprn, #450 mL. Rx at front desk for patient to pick up when able.   Mike Craze, NP [place] [phone]

## 2017-03-10 ENCOUNTER — Other Ambulatory Visit (HOSPITAL_COMMUNITY): Payer: No Typology Code available for payment source

## 2017-03-10 ENCOUNTER — Telehealth (HOSPITAL_COMMUNITY): Payer: Self-pay | Admitting: Emergency Medicine

## 2017-03-10 NOTE — Telephone Encounter (Signed)
Called pt to verify where he would like to have chemotherapy completed.  He states that he would like to have it completed all at the same facility in [place] with radiation.  Per Dr Ida Rogue note he was going to speak with [facility] about with.  We have also placed a referral to [facility].  I explained to the pt that he will want to start chemo and radiation together.  He verbalized understanding.

## 2017-03-10 NOTE — Telephone Encounter (Signed)
Patients wife notified that prescription is ready with understanding verbalized.

## 2017-03-11 ENCOUNTER — Telehealth: Payer: Self-pay | Admitting: Hematology

## 2017-03-11 NOTE — Telephone Encounter (Signed)
Left appts on the pt's vm for 8/21. Asked that he cb to confirm.

## 2017-03-14 ENCOUNTER — Telehealth: Payer: Self-pay

## 2017-03-14 ENCOUNTER — Other Ambulatory Visit (HOSPITAL_COMMUNITY): Payer: Self-pay | Admitting: Diagnostic Radiology

## 2017-03-14 ENCOUNTER — Other Ambulatory Visit: Payer: Self-pay | Admitting: Student

## 2017-03-14 DIAGNOSIS — C159 Malignant neoplasm of esophagus, unspecified: Secondary | ICD-10-CM

## 2017-03-14 NOTE — Telephone Encounter (Signed)
Left VM introducing myself as the GI Navigator and encouraging pt to call with questions or concerns.

## 2017-03-15 ENCOUNTER — Ambulatory Visit: Payer: Medicare Other

## 2017-03-15 ENCOUNTER — Ambulatory Visit (HOSPITAL_COMMUNITY): Payer: No Typology Code available for payment source

## 2017-03-15 ENCOUNTER — Telehealth: Payer: Self-pay | Admitting: *Deleted

## 2017-03-15 ENCOUNTER — Other Ambulatory Visit: Payer: Self-pay | Admitting: *Deleted

## 2017-03-15 ENCOUNTER — Other Ambulatory Visit (HOSPITAL_BASED_OUTPATIENT_CLINIC_OR_DEPARTMENT_OTHER): Payer: No Typology Code available for payment source

## 2017-03-15 ENCOUNTER — Ambulatory Visit (HOSPITAL_BASED_OUTPATIENT_CLINIC_OR_DEPARTMENT_OTHER): Payer: Medicare Other | Admitting: Hematology

## 2017-03-15 ENCOUNTER — Other Ambulatory Visit (HOSPITAL_COMMUNITY): Payer: Self-pay

## 2017-03-15 VITALS — BP 107/65 | HR 99 | Temp 99.3°F | Resp 20 | Ht 76.0 in | Wt 157.8 lb

## 2017-03-15 DIAGNOSIS — D509 Iron deficiency anemia, unspecified: Secondary | ICD-10-CM | POA: Diagnosis not present

## 2017-03-15 DIAGNOSIS — C158 Malignant neoplasm of overlapping sites of esophagus: Secondary | ICD-10-CM

## 2017-03-15 DIAGNOSIS — D649 Anemia, unspecified: Secondary | ICD-10-CM

## 2017-03-15 DIAGNOSIS — C155 Malignant neoplasm of lower third of esophagus: Secondary | ICD-10-CM

## 2017-03-15 DIAGNOSIS — R195 Other fecal abnormalities: Secondary | ICD-10-CM

## 2017-03-15 DIAGNOSIS — C154 Malignant neoplasm of middle third of esophagus: Secondary | ICD-10-CM

## 2017-03-15 LAB — COMPREHENSIVE METABOLIC PANEL
ALBUMIN: 2.8 g/dL — AB (ref 3.5–5.0)
ALK PHOS: 80 U/L (ref 40–150)
ALT: <6 U/L (ref 0–55)
ANION GAP: 5 meq/L (ref 3–11)
AST: 11 U/L (ref 5–34)
BUN: 12.1 mg/dL (ref 7.0–26.0)
CALCIUM: 8.9 mg/dL (ref 8.4–10.4)
CO2: 29 mEq/L (ref 22–29)
Chloride: 101 mEq/L (ref 98–109)
Creatinine: 0.8 mg/dL (ref 0.7–1.3)
Glucose: 111 mg/dl (ref 70–140)
POTASSIUM: 4.1 meq/L (ref 3.5–5.1)
SODIUM: 135 meq/L — AB (ref 136–145)
TOTAL PROTEIN: 6.4 g/dL (ref 6.4–8.3)

## 2017-03-15 LAB — CBC WITH DIFFERENTIAL/PLATELET
BASO%: 0.2 % (ref 0.0–2.0)
BASOS ABS: 0 10*3/uL (ref 0.0–0.1)
EOS ABS: 0.1 10*3/uL (ref 0.0–0.5)
EOS%: 1.6 % (ref 0.0–7.0)
HEMATOCRIT: 29.4 % — AB (ref 38.4–49.9)
HEMOGLOBIN: 9.8 g/dL — AB (ref 13.0–17.1)
LYMPH%: 9.5 % — ABNORMAL LOW (ref 14.0–49.0)
MCH: 28.2 pg (ref 27.2–33.4)
MCHC: 33.2 g/dL (ref 32.0–36.0)
MCV: 84.7 fL (ref 79.3–98.0)
MONO#: 0.8 10*3/uL (ref 0.1–0.9)
MONO%: 8.7 % (ref 0.0–14.0)
NEUT%: 80 % — ABNORMAL HIGH (ref 39.0–75.0)
NEUTROS ABS: 7 10*3/uL — AB (ref 1.5–6.5)
PLATELETS: 369 10*3/uL (ref 140–400)
RBC: 3.47 10*6/uL — ABNORMAL LOW (ref 4.20–5.82)
RDW: 15.7 % — AB (ref 11.0–14.6)
WBC: 8.7 10*3/uL (ref 4.0–10.3)
lymph#: 0.8 10*3/uL — ABNORMAL LOW (ref 0.9–3.3)

## 2017-03-15 LAB — IRON AND TIBC
%SAT: 15 % — ABNORMAL LOW (ref 20–55)
IRON: 34 ug/dL — AB (ref 42–163)
TIBC: 226 ug/dL (ref 202–409)
UIBC: 193 ug/dL (ref 117–376)

## 2017-03-15 LAB — CEA (IN HOUSE-CHCC): CEA (CHCC-In House): 45.19 ng/mL — ABNORMAL HIGH (ref 0.00–5.00)

## 2017-03-15 LAB — MAGNESIUM: MAGNESIUM: 2.2 mg/dL (ref 1.5–2.5)

## 2017-03-15 LAB — FERRITIN: Ferritin: 242 ng/ml (ref 22–316)

## 2017-03-15 MED ORDER — SODIUM CHLORIDE 0.9% FLUSH
10.0000 mL | INTRAVENOUS | Status: DC | PRN
  Administered 2017-03-15: 10 mL via INTRAVENOUS
  Filled 2017-03-15: qty 10

## 2017-03-15 MED ORDER — HYDROCODONE-ACETAMINOPHEN 7.5-325 MG/15ML PO SOLN: 15.0000 mL | Freq: Four times a day (QID) | ORAL | 0 refills | Status: DC | PRN

## 2017-03-15 MED ORDER — HEPARIN SOD (PORK) LOCK FLUSH 100 UNIT/ML IV SOLN
500.0000 [IU] | Freq: Once | INTRAVENOUS | Status: AC | PRN
  Administered 2017-03-15: 500 [IU] via INTRAVENOUS
  Filled 2017-03-15: qty 5

## 2017-03-15 NOTE — Progress Notes (Signed)
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     Esophageal cancer ([place])   01/29/2017 Imaging    CT abd/pelvis: 1. No acute abnormality or change from prior exam. 2. Heterogeneous nodular prostate gland with hyperdense nodularity extending into the bladder base. 3. Moderate hiatal hernia with distal esophageal wall thickening. Wall thickening likely secondary to esophagitis/reflux, cannot exclude esophageal malignancy in the setting of decreased appetite and weight loss. Consider endoscopy.      02/09/2017 Pathology Results    EGD/Colonoscopy by Dr. Laural Golden- One moderate (circumferential scarring or stenosis; an endoscope may pass) malignant-appearing, intrinsic stenosis was found 37 to 41 cm from the incisors. This measured 8 mm (inner diameter) x 4 cm (in length) and was traversed.at the distal and it was polypoidal or masslike. Biopsies were taken with a cold forceps for histology.      02/11/2017 Pathology Results    INVASIVE POORLY DIFFERENTIATED ADENOCARCINOMA.      02/15/2017 Imaging    CT Chest W Contrast 02/15/17 IMPRESSION: 1. There is a large mass involving the mid and distal esophagus. The mass invades the middle mediastinum and encases the left mainstem bronchus. 2. Evidence of mediastinal and bilateral hilar lymph node metastasis. Enlarged gastrohepatic ligament node is also noted. 3. Hiatal hernia.      02/16/2017 Cancer Staging    Cancer Staging Esophageal cancer [facility]) Staging form:  Esophagus - Adenocarcinoma, AJCC 8th Edition - Clinical stage from 02/16/2017: Stage IVA (cT4b, cN2, cM0) - Signed by Baird Cancer, PA-C on 02/16/2017       03/07/2017 Procedure    Placement of G-tube & port-a-cath by IR.        CURRENT THERAPY: Concurrent chemoradiation with weekly carbo taxol to start 03/21/17.   INTERVAL HISTORY:  Kurt Fisher is here for a follow up of Esophogeal cancer. He presents to the clinic today. He reports he would get help form his ex-wife.   He started noticing trouble swallowing in 08/2016. He then started to throw up regularly and not be able to eat certain things at the end of 11/2016. He say GI doctor in [place] and gave Protonix. He also had a hiatal hernia. He had an EGD in 02/09/17 by dr. Laural Golden.  Before feeding tube he lost about 50 pounds. He is now gaining weight since feeding tube placement. He is doing 2.5 cans a day at the moment and will increase to 6 cans a day. He is starting to feel better overall. He eats liquid like jelly and water and soup and be abel to keep it down. He lives by himself. His energy is better. His pain form the tube placement is still there when he moves certain ways.   He has 2 children 69 and 30 years old. He is on disability due to his mental health. He has a psychiatrist. He is taking Paxil and Seroquel.   His father committed suicide in 2015 and he had a mental reaction to that where he went to the hospital. He feels he is able to handle chemo diagnosis right now. His  father had cancer and his mother had lung cancer at 33. His paternal uncle had brain cancer, his paternal grandmother had lung cancer. He smokes marijuana  as needed. He takes hydrocodone every 6 hours for his pain G tube pain.    REVIEW OF SYSTEMS:   Constitutional: Denies fevers, chills or abnormal weight loss (+) weight gain  Eyes: Denies blurriness of vision Ears, nose, mouth, throat, and face: Denies mucositis or sore throat Respiratory: Denies  cough, dyspnea or wheezes Cardiovascular: Denies palpitation, chest discomfort or lower extremity swelling Gastrointestinal:  Denies nausea, heartburn or change in bowel habits (+) G tube soreness/pain upon movement Skin: Denies abnormal skin rashes Lymphatics: Denies new lymphadenopathy or easy bruising Neurological:Denies numbness, tingling or new weaknesses Behavioral/Psych: Mood is stable, no new changes (+) Bipolar, depression, anxiety All other systems were reviewed with the patient and are negative.  MEDICAL HISTORY:  Past Medical History:  Diagnosis Date  . Anxiety   . Bipolar 1 disorder ([place])   . Depression   . Esophageal cancer ([place]) 02/16/2017  . GERD (gastroesophageal reflux disease)     SURGICAL HISTORY: Past Surgical History:  Procedure Laterality Date  . BIOPSY  02/09/2017   Procedure: BIOPSY;  Surgeon: Rogene Houston, MD;  [facility];  Service: Endoscopy;;  gastric/GE Junction/distal esophagus bx   . COLONOSCOPY N/A 02/09/2017   Procedure: COLONOSCOPY;  Surgeon: Rogene Houston, MD;  Location[facility];  Service: Endoscopy;  Laterality: N/A;  . ESOPHAGOGASTRODUODENOSCOPY N/A 02/09/2017   Procedure: ESOPHAGOGASTRODUODENOSCOPY (EGD);  Surgeon: Rogene Houston, MD;  [facility];  Service: Endoscopy;  Laterality: N/A;  2:40-moved up to 1255 per Lelon Frohlich  . IR FLUORO GUIDE PORT INSERTION RIGHT  03/07/2017  . IR GASTROSTOMY TUBE MOD SED  03/07/2017  . IR US GUIDE VASC ACCESS RIGHT  03/07/2017    I have reviewed the social history and family history with the patient and they are unchanged from previous note.  ALLERGIES:  has No Known Allergies.  MEDICATIONS:  Current Outpatient Prescriptions  Medication Sig Dispense Refill  . HYDROcodone-acetaminophen (HYCET) 7.5-325 mg/15 ml solution Take 15 mLs by mouth every 6 (six) hours as needed for moderate pain. 473 mL 0  . Nutritional Supplements (FEEDING SUPPLEMENT, OSMOLITE 1.5 CAL,) LIQD Give 1 1/2  cans of tube feeding via feeding tube 4 times per day.  Flush tube with 15m of water before and after feeding.   Start with 1/2 can of tube feeding 4 times per day and increase by 1/2 can daily until goal rate of 6 cans per day reached. Send bolus tube feeding supplies. 1422 mL 0  . ondansetron (ZOFRAN) 8 MG tablet Take 1 tablet (8 mg total) by mouth every 8 (eight) hours as needed for nausea or vomiting. 30 tablet 2  . pantoprazole (PROTONIX) 40 MG tablet Take 1 tablet (40 mg total) by mouth 2 (two) times daily before a meal. 60 tablet 3  . PARoxetine (PAXIL) 40 MG tablet Take 40 mg by mouth every evening.     . prochlorperazine (COMPAZINE) 10 MG tablet Take 1 tablet (10 mg total) by mouth every 6 (six) hours as needed for nausea or vomiting. 30 tablet 2  . QUEtiapine (SEROQUEL) 300 MG tablet Take 600 mg by mouth at bedtime.      No current facility-administered medications for this visit.     PHYSICAL EXAMINATION: ECOG PERFORMANCE STATUS: 1 - Symptomatic but completely ambulatory  Vitals:   03/15/17 1440  BP: 107/65  Pulse: 99  Resp: 20  Temp: 99.3 F (37.4 C)  SpO2: 100%   Filed Weights   03/15/17 1440  Weight: 157 lb 12.8 oz (71.6 kg)    GENERAL:alert, no distress and comfortable SKIN: skin color, texture, turgor are normal, no rashes or significant lesions EYES: normal, Conjunctiva are pink and non-injected, sclera clear OROPHARYNX:no exudate, no erythema and lips, buccal mucosa, and tongue normal  NECK: supple, thyroid normal size, non-tender, without nodularity LYMPH:  no palpable lymphadenopathy in the cervical, axillary or inguinal LUNGS: clear to auscultation and percussion with normal breathing effort HEART: regular rate & rhythm and no murmurs and no lower extremity edema ABDOMEN:abdomen soft, non-tender and normal bowel sounds, (+) G tube  Musculoskeletal:no cyanosis of digits and no clubbing  NEURO: alert & oriented x 3 with fluent speech, no focal motor/sensory  deficits  LABORATORY DATA:  I have reviewed the data as listed CBC Latest Ref Rng & Units 03/15/2017 03/07/2017 03/03/2017  WBC 4.0 - 10.3 10e3/uL 8.7 8.1 9.3  Hemoglobin 13.0 - 17.1 g/dL 9.8(L) 9.4(L) 10.0(L)  Hematocrit 38.4 - 49.9 % 29.4(L) 29.2(L) 30.1(L)  Platelets 140 - 400 10e3/uL 369 375 422(H)     CMP Latest Ref Rng & Units 03/15/2017 03/03/2017 02/16/2017  Glucose 70 - 140 mg/dl 111 109(H) 105(H)  BUN 7.0 - 26.0 mg/dL 12.1 11 16   Creatinine 0.7 - 1.3 mg/dL 0.8 0.97 0.93  Sodium 136 - 145 mEq/L 135(L) 136 137  Potassium 3.5 - 5.1 mEq/L 4.1 4.1 4.1  Chloride 101 - 111 mmol/L - 102 100(L)  CO2 22 - 29 mEq/L 29 28 27   Calcium 8.4 - 10.4 mg/dL 8.9 8.7(L) 9.1  Total Protein 6.4 - 8.3 g/dL 6.4 - 7.5  Total Bilirubin 0.20 - 1.20 mg/dL <0.22 - 0.1(L)  Alkaline Phos 40 - 150 U/L 80 - 83  AST 5 - 34 U/L 11 - 15  ALT 0-55 U/L U/L <6 - 9(L)     PATHOLOGY   EGD by Dr. Laural Golden 02/09/17 Diagnosis Esophagogastric junction, biopsy - INVASIVE POORLY DIFFERENTIATED ADENOCARCINOMA. Microscopic Comment Dr. Gari Crown has reviewed the case. The case was called to Dr. Laural Golden on 02/11/2017. ADDITIONAL INFORMATION: By immunohistochemistry, the tumor cells are Negative for Her2 (1+).   PROCEDURES  Colonoscopy 02/09/17 IMPRESSION - Preparation of the colon was inadequate. - The rectum, recto-sigmoid colon, sigmoid colon, descending colon, splenic flexure, transverse colon and hepatic flexure are normal. - Examination of ascending colon was very limited because of presence of larormed stool. - External hemorrhoids. - No specimens collected.   RADIOGRAPHIC STUDIES: I have personally reviewed the radiological images as listed and agreed with the findings in the report. No results found.   EGD by Dr. Bernadene Person 02/09/2017 - Normal proximal esophagus and mid esophagus. - 4 cm long distal esophageal stricture secondary to infiltrating lesion felt to be malignant with polypoidal complement at GE junction.  Biopsied. - Z-line irregular, 41 cm from the incisors. - 2 cm hiatal hernia. - A small amount of food (residue) in the stomach. - Normal duodenal bulb and second portion of the duodenum  ASSESSMENT & PLAN:  Kurt Fisher  is a 56 y.o. caucasian male with a history of anxiety, GERD, depression, and Bipolar 1 disorder.   1. Adenocarcinoma of the Esophagus, Stage IVA ([id number]) - I reviewed scans and pathology results with pt in detail -We discussed his CT Scan which showed his cancer has spread to his lymph nodes extensively, the tumor directly  invades the left bronchus. Due to retroperitoneal node metastasis and placement of cancer this tumor is likely not resectable. I discussed that without surgery the chance of curing his cancer with chemo and RT alone is very lower. I'll present his case in thoracic tumor Board later this week, to see if Dr. Servando Snare would consider surgical resection if he has excellent response to chemotherapy and radiation. -I strongly encouraging him to consider chemotherapy radiation, to shrink his tumor, improve his dysphagia, and potentially cure if surgery can be considered after chemotherapy and irradiation. Dr. Lisbeth Renshaw has seen the patient, and will offer large radiation. To cover all his node metastasis. -I discussed the chemotherapy options. If he is not a candidate for surgery, I recommended him to consider more intensive chemotherapy FOLFOX with concurrent chemoradiation, followed by 3 cycles of consolidative chemotherapy. Certainly more side effects would be anticipated from this intensive treatment. -I'll tentatively offer weekly carboplatin and Taxol with concurrent radiation, which is a more tolerable chemotherapy regiment, if he tolerates well, I would offer consolidation chemo with FOLFOX for 3 cycles  -After lengthy discussion, patient seems not a very interesting and Taxol chemotherapy, he is agreeable with weekly carbo and Taxol. --Chemotherapy consent: Side  effects including but does not not limited to, fatigue, nausea, vomiting, diarrhea, hair loss, neuropathy, fluid retention, renal and kidney dysfunction, neutropenic fever, needed for blood transfusion, bleeding, were discussed with patient in great detail. He agrees to proceed. -The goal of therapy at this point is curative, but if surgery is not offered, this is likely palliative treatment. -Labs reviewed and his HG is 9.2. Will repeat lab for iron to see if anemia from tumor bleeding  has resolved. If it remains low I will set up IV iron.  -Start treatment on 03/21/17, set up Chemo class this week.   2. Weight loss/Nutrician  -Seen by dietician 03/04/17 -Has G Tube placement in 03/07/17 -I will refill Hydrocodone once for pain while healing but he will take only as needed for sever pain  3. Iron deficient anemia -He has moderate anemia, likely related to tumor bleeding. I'll check his iron level, and consider IV iron if he has iron deficiency.  4. Bipolar -Overall stable, continue medication.  PLAN:  -Refill hydrocodone  -Weekly lab, flush, f/u and Carbo and Taxol X6, starting 8/27 -chemo class this week -I spoke with his wife for a while after his visit, to review his diagnosis, prognosis and treatment options. She reports patient is very discouraged by the poor prognosis, but agrees to seek treatment. She seems to be supportive.   Orders Placed This Encounter  Procedures  . CBC with Differential    Standing Status:   Standing    Number of Occurrences:   80    Standing Expiration Date:   03/14/2022  . Comprehensive metabolic panel    Standing Status:   Standing    Number of Occurrences:   80    Standing Expiration Date:   03/14/2022  . CEA    Standing Status:   Standing    Number of Occurrences:   80    Standing Expiration Date:   03/14/2022  . Magnesium    Standing Status:   Standing    Number of Occurrences:   30    Standing Expiration Date:   03/15/2022  . Phosphorus     Standing Status:   Standing    Number of Occurrences:   30    Standing Expiration Date:   03/15/2022  .  Ferritin    Standing Status:   Standing    Number of Occurrences:   30    Standing Expiration Date:   03/15/2022  . Iron and TIBC    Standing Status:   Standing    Number of Occurrences:   30    Standing Expiration Date:   03/15/2022   All questions were answered. The patient knows to call the clinic with any problems, questions or concerns. No barriers to learning was detected.  I spent 40 minutes counseling the patient face to face. The total time spent in the appointment was 50 minutes and more than 50% was on counseling and review of test results  This document serves as a record of services personally performed by Truitt Merle, MD. It was created on her behalf by Joslyn Devon, a trained medical scribe. The creation of this record is based on the scribe's personal observations and the provider's statements to them. This document has been checked and approved by the attending provider.      Truitt Merle, MD 03/15/2017

## 2017-03-15 NOTE — Telephone Encounter (Signed)
Received call from Maudie Mercury Greybull with [place] [phone]~ telephone.   Requested VO for [facility] SW x1 visit to eval for SW services. VO given.

## 2017-03-15 NOTE — Progress Notes (Signed)
Nutrition Follow-up:  Met with patient during MD visit with Dr. Burr Medico today in clinic.  Patient with stage IV esophageal cancer.  Planning chemotherapy and radiation therapy.  Care has been transferred to [facility] to [facility].    Noted patient did not have refeeding labs drawn on 8/16 as originally planned but patient cancelled appointment.    Patient alone today in clinic.  Patient reports that today he will be up to 2 1/2 cans of osmolite 1.5.  Can't remember when he started tube feeding, "one day last week, I have had too much going on and can't remember that." Can't remember how he gave tube feeding yesterday (time of day).  I just give it about every 3-4 hours.  Reports that he is still eating orally, milkshakes, mashed potatoes.  Reports that he is flushing tube with 64m of water before giving tube feeding and 640mafter. Reports that he is still drinking fluids.  Patient seemed frustrated that RD was asking some many questions.  "I just don't really want this tube."  Reports nausea is controlled with medications.  Reports having regular bowel movement about 1 time per day   Medications: reviewed  Labs: reviewed but not all back yet  Anthropometrics:   Weight today increased to 157 lb 12.8 oz [facility]    Estimated Energy Needs  Kcals: 209390-3009alories/d Protein: 103-138 g/d Fluid: 2.4 L/d  NUTRITION DIAGNOSIS: Inadequate oral intake continues   MALNUTRITION DIAGNOSIS: Severe malnutrition continues   INTERVENTION:   Discussed importance and purpose of PEG tube.   Encouraged patient to continue to increase tube feeding to 4 cans of osmolite 1.5 daily (giving 1 can 3-4 hours apart) flushing with 606mf water before and after.   Tube feeding goal rate if unable to tolerate food orally will be osmolite 1.5, 6 cans per day (1 1/1 can 4 times per day) with 42m33m water before and after feeding.  Additional 3, 8 oz  cups (720ml67m water  daily will be needed orally or via tube to meet hydration needs. Also recommend promod 42ml 45my to better meet protein needs.  This will provide 2330 calories, 109 g of protein and 1086ml f10mwater (formula only) Dr. Feng adBurr Medico on Mag and Phos today and will supplement if low.  If labs are low will need to recheck until normal level. Encouraged patient to continue nutrition orally as well. Numerous options discussed with him and wife on 8/10 visit.  Contact information given to patient along with contact information Barb NeDory Peruient will be following up with her at next visit.    MONITORING, EVALUATION, GOAL: patient will utilize feeding tube to improve nutrition and prevent further weight loss   NEXT VISIT: August 30th after radiation (patient reminded of appointment and written on card)  Enslee Bibbins B. Nain Rudd, Zenia ResidesDNNurembergegNorthwest Harwintonered Dietitian [phone])

## 2017-03-16 ENCOUNTER — Telehealth: Payer: Self-pay | Admitting: *Deleted

## 2017-03-16 LAB — PHOSPHORUS: Phosphorus, Ser: 3 mg/dL (ref 2.5–4.5)

## 2017-03-16 NOTE — Telephone Encounter (Signed)
Received call from pt's wife, Kurt Fisher stating that she would like to talk with Dr Burr Medico b/c she has some questions.  She reports that pt is considering not doing chemo because of what Dr Burr Medico told him about cure rates.  She states that pt was in shock & needs to know prognosis & what the chemo can do for him if not cure.  She left call back # [phone].  Message to Dr Burr Medico.

## 2017-03-16 NOTE — Telephone Encounter (Signed)
Agree. Approved. 

## 2017-03-16 NOTE — Progress Notes (Signed)
START OFF PATHWAY REGIMEN - Gastroesophageal   [id number]:Carboplatin + Paclitaxel (2/50) + RT weekly x 6 weeks:   Administer weekly during RT:     Paclitaxel      Carboplatin   **Always confirm dose/schedule in your pharmacy ordering system**    Patient Characteristics: Distant Metastases (cM1/pM1) / Locally Recurrent Disease, Adenocarcinoma - Esophageal, GE Junction, and Gastric, First Line, HER2 Negative / Unknown Histology: Adenocarcinoma Disease Classification: Esophageal Therapeutic Status: Distant Metastases (No Additional Staging) Would you be surprised if this patient died  in the next year? I would be surprised if this patient died in the next year Line of therapy: First Line HER2 Status: Negative Intent of Therapy: Curative Intent, Discussed with Patient

## 2017-03-17 ENCOUNTER — Telehealth: Payer: Self-pay

## 2017-03-17 ENCOUNTER — Encounter: Payer: Self-pay | Admitting: *Deleted

## 2017-03-17 NOTE — Telephone Encounter (Signed)
Called patient to introduce my role as navigator and to assess education and support needs. I left voice mail and encouraged patient  To call me so that we can discuss services available to him.

## 2017-03-17 NOTE — Telephone Encounter (Signed)
I called his ex-wife Kurt Fisher back yesterday and left a message. She called back and I spoke with her. We reviewed the aggressive nature of esophageal cancer, and his metastatic disease status. His case was reviewed presented in thoracic tumor Board this morning, Dr. Pia Mau agrees to see him after neoadjuvant chemoradiation, and may consider surgery if he has excellent response to neoadjuvant chemotherapy and radiation. I encouraged patient to take chemotherapy radiation, to elevate his dysphagia, better control his disease, and hopefully he will become a candidate for surgical resection. She appreciated the call and states pt will start chemo and radiation next week.   Kurt Fisher  03/17/2017

## 2017-03-17 NOTE — Progress Notes (Signed)
[facility] Psychosocial Distress Screening Clinical Social Work  Clinical Social Work was referred by distress screening protocol.  The patient scored a 5 on the Psychosocial Distress Thermometer which indicates moderate distress. Clinical Social Worker reviewed chart and had spoken with pt two weeks ago for earlier distress screen. CSW phoned pt at home to assess for distress and other psychosocial needs. Pt shared frustrations about his cancer diagnosis and finances. Pt lives alone, but has support from ex-wife and daughters. He reports to only have Medicare part A and is quite concerned about how to afford his care. CSW discussed with pt possible assistance options available and need to consider exploring part b medicare during open enrollment time. Pt could possible receive grant funds and other financial assistance for his treatment. Referrals to be made to GI Navigator and Financial counselors for further assistance on this issue. CSW provided supportive listening and reviewed options for additional support. Pt agrees to consider and reach out as needed.   ONCBCN DISTRESS SCREENING 03/08/2017  Screening Type Initial Screening  Distress experienced in past week (1-10) 5  Practical problem type Insurance;Food  Family Problem type   Emotional problem type Depression;Nervousness/Anxiety;Adjusting to illness;Isolation/feeling alone;Boredom;Adjusting to appearance changes  Spiritual/Religous concerns type Relating to God  Information Concerns Type   Physical Problem type Pain;Nausea/vomiting;Getting around;Constipation/diarrhea;Skin dry/itchy  Physician notified of physical symptoms Yes  Referral to clinical social work Yes  Referral to dietition Yes   Clinical Social Worker follow up needed: Yes.    If yes, follow up plan:  CSW team to follow Kurt Racer, LCSW, OSW-C Clinical Social Worker [facility] Phone: [phone] Fax: [phone]

## 2017-03-18 ENCOUNTER — Other Ambulatory Visit (HOSPITAL_COMMUNITY): Payer: Self-pay | Admitting: Diagnostic Radiology

## 2017-03-18 ENCOUNTER — Ambulatory Visit (HOSPITAL_COMMUNITY)
Admission: RE | Admit: 2017-03-18 | Discharge: 2017-03-18 | Disposition: A | Discharge status: Home / Self Care | Payer: Medicare Other | Source: Ambulatory Visit | Attending: Diagnostic Radiology | Admitting: Diagnostic Radiology

## 2017-03-18 ENCOUNTER — Telehealth: Payer: Self-pay | Admitting: Hematology

## 2017-03-18 ENCOUNTER — Encounter: Payer: Self-pay | Admitting: Hematology

## 2017-03-18 ENCOUNTER — Telehealth: Payer: Self-pay | Admitting: Physician Assistant

## 2017-03-18 DIAGNOSIS — C159 Malignant neoplasm of esophagus, unspecified: Secondary | ICD-10-CM

## 2017-03-18 HISTORY — PX: IR PATIENT EVAL TECH 0-60 MINS: IMG5564

## 2017-03-18 NOTE — Procedures (Signed)
Patient came to IR to have t-tacs removed.  2 t-tacs were removed without difficulty.  New bandage was placed over g tube.  Patient instructed to call IR with any further concerns about g tube.

## 2017-03-18 NOTE — Telephone Encounter (Signed)
New Message  Levada Dy voiced unable to reach pt, have tried up to three visits with no answer to door and no returned phone calls since last friday for the  initial visit.   Levada Dy voiced she called referring MD who was [facility], which is phone number listed on referral is a [facility] number and they stated Zhoe hasnt practiced with them in over a year.  Levada Dy voiced they are discharging patient and not going to try to see him anymore with no returned phone calls she has placed morning, afternoon, and night along with leaving a message and with no returned call.  Levada Dy voiced she just wanted to let an MD know.  Please f/u if needed

## 2017-03-18 NOTE — Telephone Encounter (Signed)
Left message for patient earlier this morning re appointments for 8/27 and 8/28. Patient to get updated schedule at 8/27 visit. Also spoke with therapist on linac 4 in radonc to adjust time of xrt to after infusion.   Per navigator due to patient needs wkly f/u appointments ok to schedule w/YF Tuesday 8/2/8 and 9/11. Due to infusion room capacity and upcoming holidays infusion appointments scheduled as follows: 8/28 9/5 9/11 9/17 9/24 10/1 Navigator and YF aware.   Due to patient coming in daily for xrt - iv iron scheduled for 8/29 instead of being combined with 1st time treatment on 8/28 per nursing.

## 2017-03-21 ENCOUNTER — Telehealth: Payer: Self-pay | Admitting: Hematology

## 2017-03-21 ENCOUNTER — Ambulatory Visit: Payer: Medicare Other | Admitting: Radiation Oncology

## 2017-03-21 ENCOUNTER — Telehealth: Payer: Self-pay

## 2017-03-21 ENCOUNTER — Ambulatory Visit: Admission: RE | Admit: 2017-03-21 | Payer: Medicare Other | Source: Ambulatory Visit | Admitting: Radiation Oncology

## 2017-03-21 ENCOUNTER — Other Ambulatory Visit: Payer: No Typology Code available for payment source

## 2017-03-21 NOTE — Telephone Encounter (Signed)
Patient called this morning to cancel his appointment for Chemo Edu due to not feeling well.  He also wanted to cancel his radiation treatment.  He asked if the class was a requirement and I was unaware that it is a requirement. But I tole him I was HIGHLY recommended and that patients that do the class do a lot better with treatment.   He said that he would just come on Tuesday and start but I was unsure as was he about what he was starting (with chemo class or just starting chemo).

## 2017-03-21 NOTE — Telephone Encounter (Signed)
I will copy rad/onc also on this. I will ask NP Lattie Haw to see him before his chemo tomorrow. Thanks  Truitt Merle MD

## 2017-03-21 NOTE — Telephone Encounter (Signed)
Called and left a message with appt change from dr Burr Medico to [place]  Per [employer]

## 2017-03-21 NOTE — Telephone Encounter (Signed)
FYI: See message below 

## 2017-03-21 NOTE — Telephone Encounter (Signed)
See message below.   Truitt Merle MD

## 2017-03-22 ENCOUNTER — Other Ambulatory Visit: Payer: Self-pay | Admitting: *Deleted

## 2017-03-22 ENCOUNTER — Telehealth: Payer: Self-pay | Admitting: Nurse Practitioner

## 2017-03-22 ENCOUNTER — Ambulatory Visit
Admission: RE | Admit: 2017-03-22 | Discharge: 2017-03-22 | Disposition: A | Discharge status: Home / Self Care | Payer: Medicare Other | Source: Ambulatory Visit | Attending: Radiation Oncology | Admitting: Radiation Oncology

## 2017-03-22 ENCOUNTER — Other Ambulatory Visit: Payer: No Typology Code available for payment source

## 2017-03-22 ENCOUNTER — Other Ambulatory Visit (HOSPITAL_BASED_OUTPATIENT_CLINIC_OR_DEPARTMENT_OTHER): Payer: No Typology Code available for payment source

## 2017-03-22 ENCOUNTER — Encounter: Payer: Self-pay | Admitting: *Deleted

## 2017-03-22 ENCOUNTER — Ambulatory Visit: Admission: RE | Admit: 2017-03-22 | Payer: Medicare Other | Source: Ambulatory Visit

## 2017-03-22 ENCOUNTER — Ambulatory Visit (HOSPITAL_BASED_OUTPATIENT_CLINIC_OR_DEPARTMENT_OTHER): Payer: No Typology Code available for payment source | Admitting: Nurse Practitioner

## 2017-03-22 ENCOUNTER — Ambulatory Visit (HOSPITAL_BASED_OUTPATIENT_CLINIC_OR_DEPARTMENT_OTHER): Payer: No Typology Code available for payment source

## 2017-03-22 ENCOUNTER — Ambulatory Visit: Payer: Medicare Other

## 2017-03-22 VITALS — BP 106/64 | HR 99 | Temp 98.8°F | Resp 18 | Ht 76.0 in | Wt 156.4 lb

## 2017-03-22 VITALS — BP 109/72 | HR 72 | Temp 99.1°F | Resp 16

## 2017-03-22 DIAGNOSIS — D509 Iron deficiency anemia, unspecified: Secondary | ICD-10-CM | POA: Diagnosis not present

## 2017-03-22 DIAGNOSIS — Z5111 Encounter for antineoplastic chemotherapy: Secondary | ICD-10-CM | POA: Diagnosis not present

## 2017-03-22 DIAGNOSIS — R634 Abnormal weight loss: Secondary | ICD-10-CM

## 2017-03-22 DIAGNOSIS — R1319 Other dysphagia: Secondary | ICD-10-CM

## 2017-03-22 DIAGNOSIS — C155 Malignant neoplasm of lower third of esophagus: Secondary | ICD-10-CM

## 2017-03-22 DIAGNOSIS — C154 Malignant neoplasm of middle third of esophagus: Secondary | ICD-10-CM

## 2017-03-22 DIAGNOSIS — C159 Malignant neoplasm of esophagus, unspecified: Secondary | ICD-10-CM

## 2017-03-22 DIAGNOSIS — R195 Other fecal abnormalities: Secondary | ICD-10-CM

## 2017-03-22 LAB — COMPREHENSIVE METABOLIC PANEL
ALT: 7 U/L (ref 0–55)
ANION GAP: 9 meq/L (ref 3–11)
AST: 10 U/L (ref 5–34)
Albumin: 2.9 g/dL — ABNORMAL LOW (ref 3.5–5.0)
Alkaline Phosphatase: 79 U/L (ref 40–150)
BUN: 17.7 mg/dL (ref 7.0–26.0)
CALCIUM: 9.3 mg/dL (ref 8.4–10.4)
CO2: 27 mEq/L (ref 22–29)
CREATININE: 0.9 mg/dL (ref 0.7–1.3)
Chloride: 102 mEq/L (ref 98–109)
Glucose: 122 mg/dl (ref 70–140)
Potassium: 4 mEq/L (ref 3.5–5.1)
Sodium: 137 mEq/L (ref 136–145)
TOTAL PROTEIN: 6.8 g/dL (ref 6.4–8.3)

## 2017-03-22 LAB — CBC WITH DIFFERENTIAL/PLATELET
BASO%: 0.5 % (ref 0.0–2.0)
Basophils Absolute: 0 10*3/uL (ref 0.0–0.1)
EOS%: 2.2 % (ref 0.0–7.0)
Eosinophils Absolute: 0.2 10*3/uL (ref 0.0–0.5)
HCT: 30.4 % — ABNORMAL LOW (ref 38.4–49.9)
HGB: 10.2 g/dL — ABNORMAL LOW (ref 13.0–17.1)
LYMPH%: 13.1 % — AB (ref 14.0–49.0)
MCH: 28.4 pg (ref 27.2–33.4)
MCHC: 33.6 g/dL (ref 32.0–36.0)
MCV: 84.6 fL (ref 79.3–98.0)
MONO#: 0.7 10*3/uL (ref 0.1–0.9)
MONO%: 8.6 % (ref 0.0–14.0)
NEUT%: 75.6 % — AB (ref 39.0–75.0)
NEUTROS ABS: 6.6 10*3/uL — AB (ref 1.5–6.5)
Platelets: 380 10*3/uL (ref 140–400)
RBC: 3.59 10*6/uL — AB (ref 4.20–5.82)
RDW: 16.2 % — ABNORMAL HIGH (ref 11.0–14.6)
WBC: 8.7 10*3/uL (ref 4.0–10.3)
lymph#: 1.1 10*3/uL (ref 0.9–3.3)

## 2017-03-22 LAB — CEA (IN HOUSE-CHCC): CEA (CHCC-In House): 47.19 ng/mL — ABNORMAL HIGH (ref 0.00–5.00)

## 2017-03-22 MED ORDER — LIDOCAINE-PRILOCAINE 2.5-2.5 % EX CREA: 1.0000 "application " | TOPICAL_CREAM | CUTANEOUS | 1 refills | Status: AC | PRN

## 2017-03-22 MED ORDER — HEPARIN SOD (PORK) LOCK FLUSH 100 UNIT/ML IV SOLN
500.0000 [IU] | Freq: Once | INTRAVENOUS | Status: AC | PRN
  Administered 2017-03-22: 500 [IU]
  Filled 2017-03-22: qty 5

## 2017-03-22 MED ORDER — SODIUM CHLORIDE 0.9% FLUSH
10.0000 mL | INTRAVENOUS | Status: DC | PRN
  Administered 2017-03-22: 10 mL
  Filled 2017-03-22: qty 10

## 2017-03-22 MED ORDER — FAMOTIDINE IN NACL 20-0.9 MG/50ML-% IV SOLN
INTRAVENOUS | Status: AC
  Filled 2017-03-22: qty 50

## 2017-03-22 MED ORDER — DEXAMETHASONE SODIUM PHOSPHATE 10 MG/ML IJ SOLN
INTRAMUSCULAR | Status: AC
  Filled 2017-03-22: qty 1

## 2017-03-22 MED ORDER — DIPHENHYDRAMINE HCL 50 MG/ML IJ SOLN
INTRAMUSCULAR | Status: AC
Start: 2017-03-22 — End: ?
  Filled 2017-03-22: qty 1

## 2017-03-22 MED ORDER — PALONOSETRON HCL INJECTION 0.25 MG/5ML
INTRAVENOUS | Status: AC
  Filled 2017-03-22: qty 5

## 2017-03-22 MED ORDER — SODIUM CHLORIDE 0.9 % IV SOLN
Freq: Once | INTRAVENOUS | Status: AC
  Administered 2017-03-22: 11:00:00 via INTRAVENOUS

## 2017-03-22 MED ORDER — FAMOTIDINE IN NACL 20-0.9 MG/50ML-% IV SOLN
20.0000 mg | Freq: Once | INTRAVENOUS | Status: AC
  Administered 2017-03-22: 20 mg via INTRAVENOUS

## 2017-03-22 MED ORDER — PALONOSETRON HCL INJECTION 0.25 MG/5ML
0.2500 mg | Freq: Once | INTRAVENOUS | Status: AC
  Administered 2017-03-22: 0.25 mg via INTRAVENOUS

## 2017-03-22 MED ORDER — DEXAMETHASONE SODIUM PHOSPHATE 10 MG/ML IJ SOLN
10.0000 mg | Freq: Once | INTRAMUSCULAR | Status: AC
  Administered 2017-03-22: 10 mg via INTRAVENOUS

## 2017-03-22 MED ORDER — DIPHENHYDRAMINE HCL 50 MG/ML IJ SOLN
50.0000 mg | Freq: Once | INTRAMUSCULAR | Status: AC
  Administered 2017-03-22: 50 mg via INTRAVENOUS

## 2017-03-22 MED ORDER — SODIUM CHLORIDE 0.9% FLUSH
10.0000 mL | INTRAVENOUS | Status: DC | PRN
  Administered 2017-03-22: 10 mL via INTRAVENOUS
  Filled 2017-03-22: qty 10

## 2017-03-22 MED ORDER — SODIUM CHLORIDE 0.9 % IV SOLN
235.6000 mg | Freq: Once | INTRAVENOUS | Status: AC
  Administered 2017-03-22: 240 mg via INTRAVENOUS
  Filled 2017-03-22: qty 24

## 2017-03-22 MED ORDER — PACLITAXEL CHEMO INJECTION 300 MG/50ML
50.0000 mg/m2 | Freq: Once | INTRAVENOUS | Status: AC
  Administered 2017-03-22: 96 mg via INTRAVENOUS
  Filled 2017-03-22: qty 16

## 2017-03-22 NOTE — Telephone Encounter (Signed)
No 8/28 los -

## 2017-03-22 NOTE — Patient Instructions (Signed)
[facility] Discharge Instructions for Patients Receiving Chemotherapy  Today you received the following chemotherapy agents taxol and carboplatin.  To help prevent nausea and vomiting after your treatment, we encourage you to take your nausea medication.  You received aloxi today which is a long-acting nausea medication.  Therefore, for nausea please use compazine today, tomorrow and Thursday.  Starting Friday, you may use the zofran for nausea.   If you develop nausea and vomiting that is not controlled by your nausea medication, call the clinic.   BELOW ARE SYMPTOMS THAT SHOULD BE REPORTED IMMEDIATELY:  *FEVER GREATER THAN 100.5 F  *CHILLS WITH OR WITHOUT FEVER  NAUSEA AND VOMITING THAT IS NOT CONTROLLED WITH YOUR NAUSEA MEDICATION  *UNUSUAL SHORTNESS OF BREATH  *UNUSUAL BRUISING OR BLEEDING  TENDERNESS IN MOUTH AND THROAT WITH OR WITHOUT PRESENCE OF ULCERS  *URINARY PROBLEMS  *BOWEL PROBLEMS  UNUSUAL RASH Items with * indicate a potential emergency and should be followed up as soon as possible.  Feel free to call the clinic you have any questions or concerns. The clinic phone number is [phone].  Please show the [facility] at check-in to the Emergency Department and triage nurse.

## 2017-03-22 NOTE — Progress Notes (Signed)
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     Esophageal cancer ([facility])   01/29/2017 Imaging    CT abd/pelvis: 1. No acute abnormality or change from prior exam. 2. Heterogeneous nodular prostate gland with hyperdense nodularity extending into the bladder base. 3. Moderate hiatal hernia with distal esophageal wall thickening. Wall thickening likely secondary to esophagitis/reflux, cannot exclude esophageal malignancy in the setting of decreased appetite and weight loss. Consider endoscopy.      02/09/2017 Pathology Results    EGD/Colonoscopy by Dr. Laural Golden- One moderate (circumferential scarring or stenosis; an endoscope may pass) malignant-appearing, intrinsic stenosis was found 37 to 41 cm from the incisors. This measured 8 mm (inner diameter) x 4 cm (in length) and was traversed.at the distal and it was polypoidal or masslike. Biopsies were taken with a cold forceps for histology.      02/11/2017 Pathology Results    INVASIVE POORLY DIFFERENTIATED ADENOCARCINOMA.      02/15/2017 Imaging    CT Chest W Contrast 02/15/17 IMPRESSION: 1. There is a large mass involving the mid and distal esophagus. The mass invades the middle mediastinum and encases the left mainstem bronchus. 2. Evidence of mediastinal and bilateral hilar lymph node metastasis. Enlarged gastrohepatic ligament node is also noted. 3. Hiatal hernia.      02/16/2017 Cancer Staging    Cancer Staging Esophageal cancer [facility]) Staging form: Esophagus - Adenocarcinoma, AJCC 8th Edition - Clinical stage from 02/16/2017: Stage IVA (cT4b, cN2, cM0) -  Signed by Baird Cancer, PA-C on 02/16/2017       03/07/2017 Procedure    Placement of G-tube & port-a-cath by IR.       CURRENT THERAPY: Concurrent chemoradiation with weekly carbo/taxol beginning 03/22/2017.   INTERVAL HISTORY:   Mr. Laurel returns for follow-up. He continues to have dysphagia. No significant odynophagia. He is tolerating liquids. Main source of nutrition is via the G-tube. He continues to have intermittent nausea. Stable mild dyspnea on exertion. Main complaint is fatigue.  Objective:  Vital signs in last 24 hours:  Blood pressure 106/64, pulse 99, temperature 98.8 F (37.1 C), temperature source Oral, resp. rate 18, height 6\' 4"  (1.93 m), weight 156 lb 6.4 oz (70.9 kg), SpO2 100 %.    HEENT: White coating over tongue. No buccal thrush. Resp: Lungs clear bilaterally. Cardio: Regular rate and rhythm. GI: Abdomen soft and nontender. No hepatosplenomegaly. G-tube site is without erythema. Vascular: No leg edema. Neuro: Alert and oriented.  Skin: No rash. Port-A-Cath without erythema.    Lab Results:  Lab Results  Component Value Date   WBC 8.7 03/22/2017   HGB 10.2 (L) 03/22/2017   HCT 30.4 (L) 03/22/2017   MCV 84.6 03/22/2017   PLT 380 03/22/2017   NEUTROABS 6.6 (H) 03/22/2017    Imaging:  No results found.  Medications: I have reviewed the patient's current medications.  Assessment/Plan:  1. Adenocarcinoma of the esophagus, Stage IVA (cT4bcN2cM1). 2. Dysphagia secondary to #1. 3. Weight loss. He has seen the dietitian. He had a G-tube placed on 03/07/2017. 4. Iron deficiency anemia likely related to #1. 5. Bipolar. On medication.  Disposition: Mr. Matty appears stable. Plan to proceed with cycle 1  weekly Taxol/carboplatin today as scheduled. Potential toxicities again reviewed. He is meeting with the chemotherapy education nurse prior to beginning treatment. He is scheduled to begin radiation today as well. He will be seen in  follow-up in one week. He will contact the office in the interim with any problems.   Ned Card ANP/GNP-BC   03/22/2017  9:08 AM

## 2017-03-23 ENCOUNTER — Ambulatory Visit: Payer: No Typology Code available for payment source

## 2017-03-23 ENCOUNTER — Ambulatory Visit
Admission: RE | Admit: 2017-03-23 | Discharge: 2017-03-23 | Disposition: A | Discharge status: Home / Self Care | Payer: Medicare Other | Source: Ambulatory Visit | Attending: Radiation Oncology | Admitting: Radiation Oncology

## 2017-03-24 ENCOUNTER — Ambulatory Visit
Admission: RE | Admit: 2017-03-24 | Discharge: 2017-03-24 | Disposition: A | Discharge status: Home / Self Care | Payer: Medicare Other | Source: Ambulatory Visit | Attending: Radiation Oncology | Admitting: Radiation Oncology

## 2017-03-24 ENCOUNTER — Encounter: Payer: No Typology Code available for payment source | Admitting: Nutrition

## 2017-03-24 ENCOUNTER — Encounter: Payer: Self-pay | Admitting: Nutrition

## 2017-03-24 ENCOUNTER — Ambulatory Visit: Payer: No Typology Code available for payment source

## 2017-03-24 DIAGNOSIS — C159 Malignant neoplasm of esophagus, unspecified: Secondary | ICD-10-CM

## 2017-03-24 MED ORDER — BIAFINE EX EMUL
Freq: Every day | CUTANEOUS | Status: DC
  Administered 2017-03-24: 15:00:00 via TOPICAL

## 2017-03-24 NOTE — Progress Notes (Signed)
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     Malignant neoplasm of lower third of esophagus (HCC)   01/29/2017 Imaging    CT abd/pelvis: 1. No acute abnormality or change from prior exam. 2. Heterogeneous nodular prostate gland with hyperdense nodularity extending into the bladder base. 3. Moderate hiatal hernia with distal esophageal wall thickening. Wall thickening likely secondary to esophagitis/reflux, cannot exclude esophageal malignancy in the setting of decreased appetite and weight loss. Consider endoscopy.      02/09/2017 Pathology Results    EGD/Colonoscopy by Dr. Karilyn Cota- One moderate (circumferential scarring or stenosis; an endoscope may pass) malignant-appearing, intrinsic stenosis was found 37 to 41 cm from the incisors. This measured 8 mm (inner diameter) x 4 cm (in length) and was traversed.at the distal and it was polypoidal or masslike. Biopsies were taken with a cold forceps for histology.      02/11/2017 Pathology Results    INVASIVE POORLY DIFFERENTIATED ADENOCARCINOMA.      02/15/2017 Imaging    CT Chest W Contrast 02/15/17 IMPRESSION: 1. There is a large mass involving the mid and distal esophagus. The mass invades the middle mediastinum and encases the left mainstem bronchus. 2. Evidence of mediastinal and bilateral hilar lymph node metastasis. Enlarged gastrohepatic ligament node is also noted. 3. Hiatal hernia.      02/16/2017 Cancer Staging    Cancer Staging Esophageal  cancer [facility]) Staging form: Esophagus - Adenocarcinoma, AJCC 8th Edition - Clinical stage from 02/16/2017: Stage IVA (cT4b, cN2, cM0) - Signed by Ellouise Newer, PA-C on 02/16/2017       03/07/2017 Procedure    Placement of G-tube & port-a-cath by IR.       03/21/2017 -  Chemotherapy    Weekly Carbo Taxol with concurrent radiation       03/22/2017 -  Radiation Therapy    Concurrent chemoradiation with Dr. Mosetta Pigeon HISTORY:  Kurt Fisher is here for a follow up of Esophogeal cancer. He presents to the clinic today. He reports he would get help form his ex-wife.   He started noticing trouble swallowing in 08/2016. He then started to throw up regularly and not be able to eat certain things at the end of 11/2016. He say GI doctor in [place] and gave Protonix. He also had a hiatal hernia. He had an EGD in 02/09/17 by dr. Karilyn Cota.  Before feeding tube he lost about 50 pounds. He is now gaining weight since feeding tube placement. He is doing 2.5 cans a day at the moment and will increase to 6 cans a day. He is starting to feel better overall. He eats liquid like jelly and water and soup and be abel to keep it down. He lives by himself. His energy is better. His pain form the tube placement is still there when he moves certain ways.   He has 2 children 35 and 15 years old. He is on disability due to his mental health. He has a psychiatrist. He is taking Paxil and Seroquel.   His father  committed suicide in 2015 and he had a mental reaction to that where he went to the hospital. He feels he is able to handle chemo diagnosis right now. His father had cancer and his mother had lung cancer at 65. His paternal uncle had brain cancer, his paternal grandmother had lung cancer. He smokes marijuana  as needed. He takes hydrocodone every 6 hours for his pain G tube pain.    CURRENT THERAPY: Concurrent chemoradiation with weekly carbo taxol to start 03/21/17.   INTERVAL HISTORY:  Kurt Fisher is here for a follow up. He presents to the clinic today with his ex-wife Watt Climes. He has been having intermittent dizziness, especially when he stands up. He is quite fatigued, appetite is low, he gets 5 cans of ensure through the feeding tube. He denies any fever or chills. He was found to be hypotensive when he checked in. He also reports moderate odynophagia.   REVIEW OF SYSTEMS:   Constitutional: Denies fevers, chills or abnormal weight loss (+) weight gain  Eyes: Denies blurriness of vision Ears, nose, mouth, throat, and face: Denies mucositis or sore throat Respiratory: Denies cough, dyspnea or wheezes Cardiovascular: Denies palpitation, chest discomfort or lower extremity swelling Gastrointestinal:  Denies nausea, heartburn or change in bowel habits (+) G tube soreness/pain upon movement Skin: Denies abnormal skin rashes Lymphatics: Denies new lymphadenopathy or easy bruising Neurological:Denies numbness, tingling or new weaknesses Behavioral/Psych: Mood is stable, no new changes (+) Bipolar, depression, anxiety All other systems were reviewed with the patient and are negative.  MEDICAL HISTORY:  Past Medical History:  Diagnosis Date  . Anxiety   . Bipolar 1 disorder ([place])   . Depression   . Esophageal cancer ([place]) 02/16/2017  . GERD (gastroesophageal reflux disease)     SURGICAL HISTORY: Past Surgical History:  Procedure Laterality Date  . BIOPSY  02/09/2017   Procedure: BIOPSY;  Surgeon: Rogene Houston, MD;  [facility]: [facility];  Service: Endoscopy;;  gastric/GE Junction/distal esophagus bx   . COLONOSCOPY N/A 02/09/2017   Procedure: COLONOSCOPY;  Surgeon: Rogene Houston, MD;  Location: [facility];  Service: Endoscopy;  Laterality: N/A;  . ESOPHAGOGASTRODUODENOSCOPY N/A 02/09/2017   Procedure: ESOPHAGOGASTRODUODENOSCOPY (EGD);  Surgeon: Rogene Houston, MD;  Location: [facility];  Service: Endoscopy;  Laterality: N/A;  2:40-moved up to 1255 per Lelon Frohlich  . IR  FLUORO GUIDE PORT INSERTION RIGHT  03/07/2017  . IR GASTROSTOMY TUBE MOD SED  03/07/2017  . IR PATIENT EVAL TECH 0-60 MINS  03/18/2017  . IR US GUIDE VASC ACCESS RIGHT  03/07/2017    I have reviewed the social history and family history with the patient and they are unchanged from previous note.  ALLERGIES:  has No Known Allergies.  MEDICATIONS:  Current Outpatient Prescriptions  Medication Sig Dispense Refill  . dexamethasone (DECADRON) 4 MG tablet Take 1 tablet (4 mg total) by mouth 2 (two) times daily with a meal. 20 tablet 0  . emollient (BIAFINE) cream Apply 1 application topically 2 (two) times daily. Apply to chest area after rad tx daily and prn,nothing 4 hours prior to rad txs,    . HYDROcodone-acetaminophen (HYCET) 7.5-325 mg/15 ml solution Take 15 mLs by mouth every 6 (six) hours as needed for moderate pain. 473 mL 0  . lidocaine-prilocaine (EMLA) cream Apply 1 application topically as needed. Apply to portacath  1 1/2 hours to 2 hours prior to procedures as needed. 30 g 1  . Nutritional Supplements (FEEDING SUPPLEMENT,  OSMOLITE 1.5 CAL,) LIQD Give 1 1/2 cans of tube feeding via feeding tube 4 times per day.  Flush tube with 22m of water before and after feeding.   Start with 1/2 can of tube feeding 4 times per day and increase by 1/2 can daily until goal rate of 6 cans per day reached. Send bolus tube feeding supplies. 1422 mL 0  . ondansetron (ZOFRAN) 8 MG tablet Take 1 tablet (8 mg total) by mouth every 8 (eight) hours as needed for nausea or vomiting. 30 tablet 2  . pantoprazole (PROTONIX) 40 MG tablet Take 1 tablet (40 mg total) by mouth 2 (two) times daily before a meal. 60 tablet 3  . PARoxetine (PAXIL) 40 MG tablet Take 40 mg by mouth every evening.     . prochlorperazine (COMPAZINE) 10 MG tablet Take 1 tablet (10 mg total) by mouth every 6 (six) hours as needed for nausea or vomiting. 30 tablet 2  . QUEtiapine (SEROQUEL) 300 MG tablet Take 600 mg by mouth at bedtime.     .  sucralfate (CARAFATE) 1 GM/10ML suspension Take 10 mLs (1 g total) by mouth 4 (four) times daily -  with meals and at bedtime. 420 mL 1   No current facility-administered medications for this visit.     PHYSICAL EXAMINATION: ECOG PERFORMANCE STATUS: 3  Vitals:   03/30/17 0857  BP: (!) 87/68  Pulse: (!) 103  Resp: 18  Temp: 98.4 F (36.9 C)  SpO2: 100%   Filed Weights   03/30/17 0857  Weight: 152 lb 6.4 oz (69.1 kg)    GENERAL:alert, no distress and comfortable SKIN: skin color, texture, turgor are normal, no rashes or significant lesions EYES: normal, Conjunctiva are pink and non-injected, sclera clear OROPHARYNX:no exudate, no erythema and lips, buccal mucosa, and tongue normal  NECK: supple, thyroid normal size, non-tender, without nodularity LYMPH:  no palpable lymphadenopathy in the cervical, axillary or inguinal LUNGS: clear to auscultation and percussion with normal breathing effort HEART: regular rate & rhythm and no murmurs and no lower extremity edema ABDOMEN:abdomen soft, non-tender and normal bowel sounds, (+) G tube  Musculoskeletal:no cyanosis of digits and no clubbing  NEURO: alert & oriented x 3 with fluent speech, no focal motor/sensory deficits  LABORATORY DATA:  I have reviewed the data as listed CBC Latest Ref Rng & Units 03/30/2017 03/22/2017 03/15/2017  WBC 4.0 - 10.3 10e3/uL 3.7(L) 8.7 8.7  Hemoglobin 13.0 - 17.1 g/dL 9.4(L) 10.2(L) 9.8(L)  Hematocrit 38.4 - 49.9 % 29.2(L) 30.4(L) 29.4(L)  Platelets 140 - 400 10e3/uL 273 380 369     CMP Latest Ref Rng & Units 03/30/2017 03/22/2017 03/15/2017  Glucose 70 - 140 mg/dl 134 122 111  BUN 7.0 - 26.0 mg/dL 19.1 17.7 12.1  Creatinine 0.7 - 1.3 mg/dL 0.8 0.9 0.8  Sodium 136 - 145 mEq/L 136 137 135(L)  Potassium 3.5 - 5.1 mEq/L 3.6 4.0 4.1  Chloride 101 - 111 mmol/L - - -  CO2 22 - 29 mEq/L _0 Calcium 8.4 - 10.4 mg/dL 9.3 9.3 8.9  Total Protein 6.4 - 8.3 g/dL 6.9 6.8 6.4  Total Bilirubin 0.20 - 1.20  mg/dL <0.22 <0.22 <0.22  Alkaline Phos 40 - 150 U/L 78 79 80  AST 5 - 34 U/L _1 ALT 0 - 55 U/L 8 7 <6     PATHOLOGY   EGD by Dr. RLaural Golden7/18/18 Diagnosis Esophagogastric junction, biopsy - INVASIVE POORLY DIFFERENTIATED ADENOCARCINOMA. Microscopic Comment Dr.  Smir has reviewed the case. The case was called to Dr. Laural Golden on 02/11/2017. ADDITIONAL INFORMATION: By immunohistochemistry, the tumor cells are Negative for Her2 (1+).   PROCEDURES  Colonoscopy 02/09/17 IMPRESSION - Preparation of the colon was inadequate. - The rectum, recto-sigmoid colon, sigmoid colon, descending colon, splenic flexure, transverse colon and hepatic flexure are normal. - Examination of ascending colon was very limited because of presence of larormed stool. - External hemorrhoids. - No specimens collected.   RADIOGRAPHIC STUDIES: I have personally reviewed the radiological images as listed and agreed with the findings in the report. No results found.   EGD by Dr. Bernadene Person 02/09/2017 - Normal proximal esophagus and mid esophagus. - 4 cm long distal esophageal stricture secondary to infiltrating lesion felt to be malignant with polypoidal complement at GE junction. Biopsied. - Z-line irregular, 41 cm from the incisors. - 2 cm hiatal hernia. - A small amount of food (residue) in the stomach. - Normal duodenal bulb and second portion of the duodenum  ASSESSMENT & PLAN:  Kurt Fisher  is a 56 y.o. caucasian male with a history of anxiety, GERD, depression, and Bipolar disorder.   1. Adenocarcinoma of the Esophagus, Stage IVA (cT4bcN2cM1) with nodes metastasis  - I reviewed scans and pathology results with pt in detail -We discussed his CT Scan which showed his cancer has spread to his lymph nodes extensively, the tumor directly invades the left bronchus. Due to retroperitoneal node metastasis, his cancer is likely not resectable. I discussed that without surgery the chance of curing his cancer  with chemo and RT alone is very lower. -Dr. Servando Snare has agreed to see him and evaluate his candidacy for surgery if he has excellent response to concurrent chemoradiation.  -Patient has agreed with concurrent chemotherapy and irradiation, his not very interested in intensive chemotherapy, such as FOLFOX, I of weekly carboplatin and Taxol with concurrent radiation.  -The goal of therapy at this point is curative, but if surgery is not offered, this is likely palliative treatment. -Due to his hypertension I plan to hold his chemotherapy today, and give IV fluids he is also scheduled for second dose of Feraheme. -Continue supportive care. I encouraged him to get more fluids through his feeding tube if he does not want to drink.  -lab reviewed  2. Odynophagia -Second to radiation, chemotherapy and his underlying esophageal cancer -He will continue use hydrocodone as needed -I cautery in Carafate for him today  3. Nausea  -We discussed nausea management, he will take Zofran and Compazine more regularly, especially before meal -I'll call in dexamethasone 4 mg once daily   4. Weight loss/Nutrician  -Seen by dietician 03/04/17, will continue f/u -Has G Tube placement in 03/07/17 - continue hydrocodone   5. Iron deficient anemia -He has moderate anemia, likely related to tumor bleeding. Our studies showed a low iron. -I will give HER-2 doses of IV Feraheme  6. Bipolar -Overall stable, continue medication.  7. Hypotension and dehydration -IV fluids today -I'll ask patient to call us in 2 days, to see if he needs more IV fluids this week.  PLAN:  -Due to his hypotension, I will hold his chemotherapy this week. -Normal saline 1 L today, and second dose of IV Feraheme -He will call us in 2 days to update [place], to see if he needs IV fluids. -I called in dexamethasone 4 mg daily for his nausea, and Carafate for his odynophagia -I'll see him back next week.    No orders  of the defined types  were placed in this encounter.  All questions were answered. The patient knows to call the clinic with any problems, questions or concerns. No barriers to learning was detected.  I spent 20 minutes counseling the patient face to face. The total time spent in the appointment was 30 minutes and more than 50% was on counseling and review of test results  This document serves as a record of services personally performed by Truitt Merle, MD. It was created on her behalf by Joslyn Devon, a trained medical scribe. The creation of this record is based on the scribe's personal observations and the provider's statements to them. This document has been checked and approved by the attending provider.      Truitt Merle, MD 03/30/2017

## 2017-03-24 NOTE — Progress Notes (Signed)
Patient did not show up for nutrition appointment. 

## 2017-03-24 NOTE — Progress Notes (Signed)
Pt education done again, lung, gave biafine cream, my business card, radiation therapy and you book, discussed side effects, cough, pain, fatigue, skin irritation, throat irritation, loss chest hair, weight loss, may need IVF'S, use biafine after rad txs to chest area daily and prn, use his g ube for his feedings, osmolite, throat pain, may need carafate rx , exercise, get plenty sleep and rest, sees MD weekly and prn, luke warm bath/showers, dove unscented soap Tech back given 2:35 PM

## 2017-03-25 ENCOUNTER — Ambulatory Visit (HOSPITAL_COMMUNITY)
Admission: RE | Admit: 2017-03-25 | Discharge: 2017-03-25 | Disposition: A | Discharge status: Home / Self Care | Payer: Medicare Other | Source: Ambulatory Visit | Attending: Physician Assistant | Admitting: Physician Assistant

## 2017-03-25 ENCOUNTER — Other Ambulatory Visit: Payer: Self-pay | Admitting: *Deleted

## 2017-03-25 ENCOUNTER — Ambulatory Visit
Admission: RE | Admit: 2017-03-25 | Discharge: 2017-03-25 | Disposition: A | Discharge status: Home / Self Care | Payer: Medicare Other | Source: Ambulatory Visit | Attending: Radiation Oncology | Admitting: Radiation Oncology

## 2017-03-25 ENCOUNTER — Telehealth: Payer: Self-pay

## 2017-03-25 ENCOUNTER — Other Ambulatory Visit: Payer: Self-pay | Admitting: Hematology

## 2017-03-25 DIAGNOSIS — D509 Iron deficiency anemia, unspecified: Secondary | ICD-10-CM

## 2017-03-25 MED ORDER — SODIUM CHLORIDE 0.9% FLUSH
10.0000 mL | INTRAVENOUS | Status: AC | PRN
  Administered 2017-03-25: 10 mL

## 2017-03-25 MED ORDER — SODIUM CHLORIDE 0.9 % IV SOLN
510.0000 mg | Freq: Once | INTRAVENOUS | Status: AC
  Administered 2017-03-25: 510 mg via INTRAVENOUS
  Filled 2017-03-25: qty 17

## 2017-03-25 MED ORDER — HEPARIN SOD (PORK) LOCK FLUSH 100 UNIT/ML IV SOLN
500.0000 [IU] | INTRAVENOUS | Status: AC | PRN
  Administered 2017-03-25: 500 [IU]
  Filled 2017-03-25: qty 5

## 2017-03-25 NOTE — Telephone Encounter (Signed)
I spoke with Mr. Klemz to see how he tolerated his first chemo tx. Patient states that he is "doing good." I introduced myself and the role of the GI Navigator. Patient did not have any questions or needs at the present time. Patient encouraged to call with any questions or concerns.

## 2017-03-25 NOTE — Discharge Instructions (Signed)

## 2017-03-25 NOTE — Progress Notes (Signed)
Procedure: Infusion of IV fereheme, 510 mg per order  Ordering Provider: Dr. Burr Medico  Diagnosis: Adenocarcinoma of esophagus   Pt received IV infusion of fereheme; infusion completed and pt. Observed post infusion for 30 minutes with no complications noted; pt alert, oriented, accompanied by family upon discharge

## 2017-03-29 ENCOUNTER — Ambulatory Visit
Admission: RE | Admit: 2017-03-29 | Discharge: 2017-03-29 | Disposition: A | Discharge status: Home / Self Care | Payer: Medicare Other | Source: Ambulatory Visit | Attending: Radiation Oncology | Admitting: Radiation Oncology

## 2017-03-29 NOTE — Progress Notes (Addendum)
  Radiation Oncology         [phone]) [phone] ________________________________  Name: PAETON STUDER MRN: [id number]  Date: 03/09/2017  DOB: 01/18/1961  SIMULATION AND TREATMENT PLANNING NOTE  DIAGNOSIS:     ICD-10-CM   1. 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  Identity was confirmed.  All relevant records and images related to the planned course of therapy were reviewed.   Written consent to proceed with treatment was confirmed which was freely given after reviewing the details related to the planned course of therapy had been reviewed with the patient.  Then, the patient was set-up in a stable reproducible  supine position for radiation therapy.  CT images were obtained.  Surface markings were placed.    Medically necessary complex treatment device(s) for immobilization:  Vac-lock bag.   The CT images were loaded into the planning software.  Then the target and avoidance structures were contoured.  Treatment planning then occurred.  The radiation prescription was entered and confirmed.  I have requested : Intensity Modulated Radiotherapy (IMRT) is medically necessary for this case for the following reason:  Sparing of adjacent critical normal structures including the heart, spinal cord and lungs.   The patient will undergo daily image guidance to ensure accurate localization of the target, and adequate minimize dose to the normal surrounding structures in close proximity to the target.   PLAN:  The patient will receive 45 Gy in 25 fractions initially, the patient will then receive a 9 gray boost to yield a total dose of 54 gray.  Special treatment procedure The patient will also receive concurrent chemotherapy during the treatment. The patient may therefore experience increased toxicity or side effects and the patient will be monitored for such problems. This  may require extra lab work as necessary. This therefore constitutes a special treatment procedure.   ________________________________   Jodelle Gross, MD, PhD

## 2017-03-30 ENCOUNTER — Ambulatory Visit (HOSPITAL_BASED_OUTPATIENT_CLINIC_OR_DEPARTMENT_OTHER): Payer: No Typology Code available for payment source

## 2017-03-30 ENCOUNTER — Other Ambulatory Visit (HOSPITAL_BASED_OUTPATIENT_CLINIC_OR_DEPARTMENT_OTHER): Payer: No Typology Code available for payment source

## 2017-03-30 ENCOUNTER — Ambulatory Visit: Payer: No Typology Code available for payment source

## 2017-03-30 ENCOUNTER — Ambulatory Visit: Payer: No Typology Code available for payment source | Admitting: Nutrition

## 2017-03-30 ENCOUNTER — Encounter: Payer: Self-pay | Admitting: Radiation Oncology

## 2017-03-30 ENCOUNTER — Ambulatory Visit
Admission: RE | Admit: 2017-03-30 | Discharge: 2017-03-30 | Disposition: A | Discharge status: Home / Self Care | Payer: Medicare Other | Source: Ambulatory Visit | Attending: Radiation Oncology | Admitting: Radiation Oncology

## 2017-03-30 ENCOUNTER — Ambulatory Visit (HOSPITAL_BASED_OUTPATIENT_CLINIC_OR_DEPARTMENT_OTHER): Payer: No Typology Code available for payment source | Admitting: Hematology

## 2017-03-30 VITALS — BP 115/72 | HR 80 | Resp 16

## 2017-03-30 VITALS — BP 87/68 | HR 103 | Temp 98.4°F | Resp 18 | Ht 76.0 in | Wt 152.4 lb

## 2017-03-30 DIAGNOSIS — R1310 Dysphagia, unspecified: Secondary | ICD-10-CM | POA: Diagnosis not present

## 2017-03-30 DIAGNOSIS — I959 Hypotension, unspecified: Secondary | ICD-10-CM | POA: Diagnosis not present

## 2017-03-30 DIAGNOSIS — C155 Malignant neoplasm of lower third of esophagus: Secondary | ICD-10-CM

## 2017-03-30 DIAGNOSIS — R634 Abnormal weight loss: Secondary | ICD-10-CM

## 2017-03-30 DIAGNOSIS — C154 Malignant neoplasm of middle third of esophagus: Secondary | ICD-10-CM

## 2017-03-30 DIAGNOSIS — D509 Iron deficiency anemia, unspecified: Secondary | ICD-10-CM

## 2017-03-30 DIAGNOSIS — E860 Dehydration: Secondary | ICD-10-CM | POA: Diagnosis not present

## 2017-03-30 DIAGNOSIS — R195 Other fecal abnormalities: Secondary | ICD-10-CM

## 2017-03-30 LAB — COMPREHENSIVE METABOLIC PANEL
ALBUMIN: 3.1 g/dL — AB (ref 3.5–5.0)
ALK PHOS: 78 U/L (ref 40–150)
ALT: 8 U/L (ref 0–55)
ANION GAP: 9 meq/L (ref 3–11)
AST: 10 U/L (ref 5–34)
BUN: 19.1 mg/dL (ref 7.0–26.0)
CALCIUM: 9.3 mg/dL (ref 8.4–10.4)
CHLORIDE: 102 meq/L (ref 98–109)
CO2: 25 mEq/L (ref 22–29)
CREATININE: 0.8 mg/dL (ref 0.7–1.3)
EGFR: >90 mL/min/{1.73_m2} (ref >90)
Glucose: 134 mg/dl (ref 70–140)
Potassium: 3.6 mEq/L (ref 3.5–5.1)
Sodium: 136 mEq/L (ref 136–145)
Total Protein: 6.9 g/dL (ref 6.4–8.3)

## 2017-03-30 LAB — CBC WITH DIFFERENTIAL/PLATELET
BASO%: 0 % (ref 0.0–2.0)
BASOS ABS: 0 10*3/uL (ref 0.0–0.1)
EOS%: 2.2 % (ref 0.0–7.0)
Eosinophils Absolute: 0.1 10*3/uL (ref 0.0–0.5)
HEMATOCRIT: 29.2 % — AB (ref 38.4–49.9)
HEMOGLOBIN: 9.4 g/dL — AB (ref 13.0–17.1)
LYMPH#: 0.5 10*3/uL — AB (ref 0.9–3.3)
LYMPH%: 12.2 % — ABNORMAL LOW (ref 14.0–49.0)
MCH: 27.7 pg (ref 27.2–33.4)
MCHC: 32.2 g/dL (ref 32.0–36.0)
MCV: 86.1 fL (ref 79.3–98.0)
MONO#: 0.4 10*3/uL (ref 0.1–0.9)
MONO%: 10.8 % (ref 0.0–14.0)
NEUT#: 2.8 10*3/uL (ref 1.5–6.5)
NEUT%: 74.8 % (ref 39.0–75.0)
PLATELETS: 273 10*3/uL (ref 140–400)
RBC: 3.39 10*6/uL — ABNORMAL LOW (ref 4.20–5.82)
RDW: 15.9 % — AB (ref 11.0–14.6)
WBC: 3.7 10*3/uL — ABNORMAL LOW (ref 4.0–10.3)

## 2017-03-30 LAB — CEA (IN HOUSE-CHCC): CEA (CHCC-IN HOUSE): 47.74 ng/mL — AB (ref 0.00–5.00)

## 2017-03-30 MED ORDER — DEXAMETHASONE 4 MG PO TABS: 4.0000 mg | ORAL_TABLET | Freq: Two times a day (BID) | ORAL | 0 refills | Status: AC

## 2017-03-30 MED ORDER — SODIUM CHLORIDE 0.9 % IV SOLN
Freq: Once | INTRAVENOUS | Status: AC
  Administered 2017-03-30: 10:00:00 via INTRAVENOUS

## 2017-03-30 MED ORDER — HEPARIN SOD (PORK) LOCK FLUSH 100 UNIT/ML IV SOLN
500.0000 [IU] | Freq: Once | INTRAVENOUS | Status: AC | PRN
  Administered 2017-03-30: 500 [IU]
  Filled 2017-03-30: qty 5

## 2017-03-30 MED ORDER — SUCRALFATE 1 GM/10ML PO SUSP: 1.0000 g | Freq: Three times a day (TID) | ORAL | 1 refills | Status: DC

## 2017-03-30 MED ORDER — SODIUM CHLORIDE 0.9 % IV SOLN
510.0000 mg | Freq: Once | INTRAVENOUS | Status: AC
  Administered 2017-03-30: 510 mg via INTRAVENOUS
  Filled 2017-03-30: qty 17

## 2017-03-30 MED ORDER — SODIUM CHLORIDE 0.9% FLUSH
10.0000 mL | INTRAVENOUS | Status: DC | PRN
  Administered 2017-03-30: 10 mL
  Filled 2017-03-30: qty 10

## 2017-03-30 NOTE — Patient Instructions (Signed)
Ferumoxytol injection What is this medicine? FERUMOXYTOL is an iron complex. Iron is used to make healthy red blood cells, which carry oxygen and nutrients throughout the body. This medicine is used to treat iron deficiency anemia in people with chronic kidney disease. This medicine may be used for other purposes; ask your health care provider or pharmacist if you have questions. COMMON BRAND NAME(S): Feraheme What should I tell my health care provider before I take this medicine? They need to know if you have any of these conditions: -anemia not caused by low iron levels -high levels of iron in the blood -magnetic resonance imaging (MRI) test scheduled -an unusual or allergic reaction to iron, other medicines, foods, dyes, or preservatives -pregnant or trying to get pregnant -breast-feeding How should I use this medicine? This medicine is for injection into a vein. It is given by a health care professional in a hospital or clinic setting. Talk to your pediatrician regarding the use of this medicine in children. Special care may be needed. Overdosage: If you think you have taken too much of this medicine contact a poison control center or emergency room at once. NOTE: This medicine is only for you. Do not share this medicine with others. What if I miss a dose? It is important not to miss your dose. Call your doctor or health care professional if you are unable to keep an appointment. What may interact with this medicine? This medicine may interact with the following medications: -other iron products This list may not describe all possible interactions. Give your health care provider a list of all the medicines, herbs, non-prescription drugs, or dietary supplements you use. Also tell them if you smoke, drink alcohol, or use illegal drugs. Some items may interact with your medicine. What should I watch for while using this medicine? Visit your doctor or healthcare professional regularly. Tell  your doctor or healthcare professional if your symptoms do not start to get better or if they get worse. You may need blood work done while you are taking this medicine. You may need to follow a special diet. Talk to your doctor. Foods that contain iron include: whole grains/cereals, dried fruits, beans, or peas, leafy green vegetables, and organ meats (liver, kidney). What side effects may I notice from receiving this medicine? Side effects that you should report to your doctor or health care professional as soon as possible: -allergic reactions like skin rash, itching or hives, swelling of the face, lips, or tongue -breathing problems -changes in blood pressure -feeling faint or lightheaded, falls -fever or chills -flushing, sweating, or hot feelings -swelling of the ankles or feet Side effects that usually do not require medical attention (report to your doctor or health care professional if they continue or are bothersome): -diarrhea -headache -nausea, vomiting -stomach pain This list may not describe all possible side effects. Call your doctor for medical advice about side effects. You may report side effects to FDA at 1-800-FDA-1088. Where should I keep my medicine? This drug is given in a hospital or clinic and will not be stored at home. NOTE: This sheet is a summary. It may not cover all possible information. If you have questions about this medicine, talk to your doctor, pharmacist, or health care provider.  2018 Elsevier/Gold Standard (2015-08-14 12:41:49)   Dehydration, Adult Dehydration is a condition in which there is not enough fluid or water in the body. This happens when you lose more fluids than you take in. Important organs, such as the  kidneys, brain, and heart, cannot function without a proper amount of fluids. Any loss of fluids from the body can lead to dehydration. Dehydration can range from mild to severe. This condition should be treated right away to prevent it  from becoming severe. What are the causes? This condition may be caused by:  Vomiting.  Diarrhea.  Excessive sweating, such as from heat exposure or exercise.  Not drinking enough fluid, especially: ? When ill. ? While doing activity that requires a lot of energy.  Excessive urination.  Fever.  Infection.  Certain medicines, such as medicines that cause the body to lose excess fluid (diuretics).  Inability to access safe drinking water.  Reduced physical ability to get adequate water and food.  What increases the risk? This condition is more likely to develop in people:  Who have a poorly controlled long-term (chronic) illness, such as diabetes, heart disease, or kidney disease.  Who are age 56 or older.  Who are disabled.  Who live in a place with high altitude.  Who play endurance sports.  What are the signs or symptoms? Symptoms of mild dehydration may include:  Thirst.  Dry lips.  Slightly dry mouth.  Dry, warm skin.  Dizziness. Symptoms of moderate dehydration may include:  Very dry mouth.  Muscle cramps.  Dark urine. Urine may be the color of tea.  Decreased urine production.  Decreased tear production.  Heartbeat that is irregular or faster than normal (palpitations).  Headache.  Light-headedness, especially when you stand up from a sitting position.  Fainting (syncope). Symptoms of severe dehydration may include:  Changes in skin, such as: ? Cold and clammy skin. ? Blotchy (mottled) or pale skin. ? Skin that does not quickly return to normal after being lightly pinched and released (poor skin turgor).  Changes in body fluids, such as: ? Extreme thirst. ? No tear production. ? Inability to sweat when body temperature is high, such as in hot weather. ? Very little urine production.  Changes in vital signs, such as: ? Weak pulse. ? Pulse that is more than 100 beats a minute when sitting still. ? Rapid breathing. ? Low blood  pressure.  Other changes, such as: ? Sunken eyes. ? Cold hands and feet. ? Confusion. ? Lack of energy (lethargy). ? Difficulty waking up from sleep. ? Short-term weight loss. ? Unconsciousness. How is this diagnosed? This condition is diagnosed based on your symptoms and a physical exam. Blood and urine tests may be done to help confirm the diagnosis. How is this treated? Treatment for this condition depends on the severity. Mild or moderate dehydration can often be treated at home. Treatment should be started right away. Do not wait until dehydration becomes severe. Severe dehydration is an emergency and it needs to be treated in a hospital. Treatment for mild dehydration may include:  Drinking more fluids.  Replacing salts and minerals in your blood (electrolytes) that you may have lost. Treatment for moderate dehydration may include:  Drinking an oral rehydration solution (ORS). This is a drink that helps you replace fluids and electrolytes (rehydrate). It can be found at pharmacies and retail stores. Treatment for severe dehydration may include:  Receiving fluids through an IV tube.  Receiving an electrolyte solution through a feeding tube that is passed through your nose and into your stomach (nasogastric tube, or NG tube).  Correcting any abnormalities in electrolytes.  Treating the underlying cause of dehydration. Follow these instructions at home:  If directed by your health  care provider, drink an ORS: ? Make an ORS by following instructions on the package. ? Start by drinking small amounts, about  cup (120 mL) every 5-10 minutes. ? Slowly increase how much you drink until you have taken the amount recommended by your health care provider.  Drink enough clear fluid to keep your urine clear or pale yellow. If you were told to drink an ORS, finish the ORS first, then start slowly drinking other clear fluids. Drink fluids such as: ? Water. Do not drink only water. Doing  that can lead to having too little salt (sodium) in the body (hyponatremia). ? Ice chips. ? Fruit juice that you have added water to (diluted fruit juice). ? Low-calorie sports drinks.  Avoid: ? Alcohol. ? Drinks that contain a lot of sugar. These include high-calorie sports drinks, fruit juice that is not diluted, and soda. ? Caffeine. ? Foods that are greasy or contain a lot of fat or sugar.  Take over-the-counter and prescription medicines only as told by your health care provider.  Do not take sodium tablets. This can lead to having too much sodium in the body (hypernatremia).  Eat foods that contain a healthy balance of electrolytes, such as bananas, oranges, potatoes, tomatoes, and spinach.  Keep all follow-up visits as told by your health care provider. This is important. Contact a health care provider if:  You have abdominal pain that: ? Gets worse. ? Stays in one area (localizes).  You have a rash.  You have a stiff neck.  You are more irritable than usual.  You are sleepier or more difficult to wake up than usual.  You feel weak or dizzy.  You feel very thirsty.  You have urinated only a small amount of very dark urine over 6-8 hours. Get help right away if:  You have symptoms of severe dehydration.  You cannot drink fluids without vomiting.  Your symptoms get worse with treatment.  You have a fever.  You have a severe headache.  You have vomiting or diarrhea that: ? Gets worse. ? Does not go away.  You have blood or green matter (bile) in your vomit.  You have blood in your stool. This may cause stool to look black and tarry.  You have not urinated in 6-8 hours.  You faint.  Your heart rate while sitting still is over 100 beats a minute.  You have trouble breathing. This information is not intended to replace advice given to you by your health care provider. Make sure you discuss any questions you have with your health care  provider. Document Released: 07/12/2005 Document Revised: 02/06/2016 Document Reviewed: 09/05/2015 Elsevier Interactive Patient Education  [employer].

## 2017-03-30 NOTE — Patient Instructions (Signed)
Implanted Port Home Guide An implanted port is a type of central line that is placed under the skin. Central lines are used to provide IV access when treatment or nutrition needs to be given through a person's veins. Implanted ports are used for long-term IV access. An implanted port may be placed because:  You need IV medicine that would be irritating to the small veins in your hands or arms.  You need long-term IV medicines, such as antibiotics.  You need IV nutrition for a long period.  You need frequent blood draws for lab tests.  You need dialysis.  Implanted ports are usually placed in the chest area, but they can also be placed in the upper arm, the abdomen, or the leg. An implanted port has two main parts:  Reservoir. The reservoir is round and will appear as a small, raised area under your skin. The reservoir is the part where a needle is inserted to give medicines or draw blood.  Catheter. The catheter is a thin, flexible tube that extends from the reservoir. The catheter is placed into a large vein. Medicine that is inserted into the reservoir goes into the catheter and then into the vein.  How will I care for my incision site? Do not get the incision site wet. Bathe or shower as directed by your health care provider. How is my port accessed? Special steps must be taken to access the port:  Before the port is accessed, a numbing cream can be placed on the skin. This helps numb the skin over the port site.  Your health care provider uses a sterile technique to access the port. ? Your health care provider must put on a mask and sterile gloves. ? The skin over your port is cleaned carefully with an antiseptic and allowed to dry. ? The port is gently pinched between sterile gloves, and a needle is inserted into the port.  Only "non-coring" port needles should be used to access the port. Once the port is accessed, a blood return should be checked. This helps ensure that the port  is in the vein and is not clogged.  If your port needs to remain accessed for a constant infusion, a clear (transparent) bandage will be placed over the needle site. The bandage and needle will need to be changed every week, or as directed by your health care provider.  Keep the bandage covering the needle clean and dry. Do not get it wet. Follow your health care provider's instructions on how to take a shower or bath while the port is accessed.  If your port does not need to stay accessed, no bandage is needed over the port.  What is flushing? Flushing helps keep the port from getting clogged. Follow your health care provider's instructions on how and when to flush the port. Ports are usually flushed with saline solution or a medicine called heparin. The need for flushing will depend on how the port is used.  If the port is used for intermittent medicines or blood draws, the port will need to be flushed: ? After medicines have been given. ? After blood has been drawn. ? As part of routine maintenance.  If a constant infusion is running, the port may not need to be flushed.  How long will my port stay implanted? The port can stay in for as long as your health care provider thinks it is needed. When it is time for the port to come out, surgery will be   done to remove it. The procedure is similar to the one performed when the port was put in. When should I seek immediate medical care? When you have an implanted port, you should seek immediate medical care if:  You notice a bad smell coming from the incision site.  You have swelling, redness, or drainage at the incision site.  You have more swelling or pain at the port site or the surrounding area.  You have a fever that is not controlled with medicine.  This information is not intended to replace advice given to you by your health care provider. Make sure you discuss any questions you have with your health care provider. Document  Released: 07/12/2005 Document Revised: 12/18/2015 Document Reviewed: 03/19/2013 Elsevier Interactive Patient Education  2017 [employer]  

## 2017-03-30 NOTE — Progress Notes (Signed)
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 Weight decreased and documented as 152.4 pounds September 5, decreased from 156.4 pounds August 28. Patient reports he has increased Osmolite 1.5 via feeding tube to 5 bottles a day. Requesting to give 2 bottles at a time. Continues to drink some Ensure Plus by mouth along with other liquids. He also tries to eat soft foods. Patient states he still has pain with his feeding tube.  Estimated energy needs: 2070-2415 calories, 103-138 grams protein, 2.4 L fluid.  Nutrition diagnosis: Inadequate oral intake continues. Severe malnutrition.  Continue.  Intervention: Educated patient he could increase Osmolite 1.5-2 cans twice a day with 60 mL free water before and after bolus feedings as tolerated. Provided one complementary case of Ensure Plus along with Osmolite 1.5 samples. Recommended patient continue to eat and drink by mouth as tolerated.  Monitoring, evaluation, goals: Patient will tolerate Osmolite 1.5 at goal rate to minimize weight loss. He will continue to increase oral intake to provide additional calories, protein and fluid.  Next visit: Monday, October 1 during infusion.  **Disclaimer: This note was dictated with voice recognition software. Similar sounding words can inadvertently be transcribed and this note may contain transcription errors which may not have been corrected upon publication of note.**

## 2017-03-31 ENCOUNTER — Ambulatory Visit: Payer: Medicare Other

## 2017-03-31 ENCOUNTER — Ambulatory Visit: Payer: No Typology Code available for payment source

## 2017-03-31 ENCOUNTER — Telehealth: Payer: Self-pay | Admitting: *Deleted

## 2017-03-31 NOTE — Telephone Encounter (Signed)
Pt's ex-wife called stating that pt called & cancelled chemo & radiation today.  She states that he wasn't feeling well.  She states that he is drinking water & doing tube feedings.  No notation seen that he called to cancel.  Discussed with Dr Burr Medico & called [facility] back & instructed that if he is really feeling dehydrated or running fever to to to ED but otherwise we will try to schedule for IVF tomorrow instead of chemo per Dr Burr Medico.  Expect call from schedulers.  LOS sent.

## 2017-04-01 ENCOUNTER — Ambulatory Visit
Admission: RE | Admit: 2017-04-01 | Discharge: 2017-04-01 | Disposition: A | Discharge status: Home / Self Care | Payer: Medicare Other | Source: Ambulatory Visit | Attending: Radiation Oncology | Admitting: Radiation Oncology

## 2017-04-01 ENCOUNTER — Encounter: Payer: Self-pay | Admitting: Hematology

## 2017-04-01 ENCOUNTER — Ambulatory Visit (HOSPITAL_BASED_OUTPATIENT_CLINIC_OR_DEPARTMENT_OTHER): Payer: No Typology Code available for payment source

## 2017-04-01 ENCOUNTER — Telehealth: Payer: Self-pay | Admitting: *Deleted

## 2017-04-01 ENCOUNTER — Telehealth: Payer: Self-pay | Admitting: Hematology

## 2017-04-01 VITALS — BP 103/70 | HR 87 | Temp 98.9°F | Resp 18

## 2017-04-01 DIAGNOSIS — C155 Malignant neoplasm of lower third of esophagus: Secondary | ICD-10-CM | POA: Diagnosis not present

## 2017-04-01 DIAGNOSIS — R195 Other fecal abnormalities: Secondary | ICD-10-CM

## 2017-04-01 DIAGNOSIS — Z5189 Encounter for other specified aftercare: Secondary | ICD-10-CM | POA: Diagnosis not present

## 2017-04-01 DIAGNOSIS — D509 Iron deficiency anemia, unspecified: Secondary | ICD-10-CM

## 2017-04-01 DIAGNOSIS — E860 Dehydration: Secondary | ICD-10-CM

## 2017-04-01 MED ORDER — SODIUM CHLORIDE 0.9% FLUSH
10.0000 mL | INTRAVENOUS | Status: DC | PRN
  Administered 2017-04-01: 10 mL via INTRAVENOUS
  Filled 2017-04-01: qty 10

## 2017-04-01 MED ORDER — SODIUM CHLORIDE 0.9 % IV SOLN
1000.0000 mL | Freq: Once | INTRAVENOUS | Status: AC
  Administered 2017-04-01: 1000 mL via INTRAVENOUS

## 2017-04-01 MED ORDER — SODIUM CHLORIDE 0.9 % IV SOLN: 1000.0000 mL | Freq: Once | INTRAVENOUS | Status: DC

## 2017-04-01 MED ORDER — SODIUM CHLORIDE 0.9 % IV SOLN: Freq: Once | INTRAVENOUS | Status: DC

## 2017-04-01 MED ORDER — HEPARIN SOD (PORK) LOCK FLUSH 100 UNIT/ML IV SOLN
500.0000 [IU] | Freq: Once | INTRAVENOUS | Status: AC | PRN
  Administered 2017-04-01: 500 [IU] via INTRAVENOUS
  Filled 2017-04-01: qty 5

## 2017-04-01 MED ORDER — ONDANSETRON HCL 4 MG/2ML IJ SOLN
INTRAMUSCULAR | Status: AC
  Filled 2017-04-01: qty 2

## 2017-04-01 MED ORDER — SODIUM CHLORIDE 0.9 % IV SOLN
Freq: Once | INTRAVENOUS | Status: DC
  Administered 2017-04-01: 16:00:00 via INTRAVENOUS

## 2017-04-01 MED ORDER — ONDANSETRON HCL 4 MG/2ML IJ SOLN
4.0000 mg | Freq: Once | INTRAMUSCULAR | Status: AC
  Administered 2017-04-01: 4 mg via INTRAVENOUS

## 2017-04-01 MED ORDER — RADIAPLEXRX EX GEL
Freq: Once | CUTANEOUS | Status: AC
  Administered 2017-04-01: 16:00:00 via TOPICAL

## 2017-04-01 NOTE — Telephone Encounter (Signed)
sw pt to confirm added ivf 9/7 at 3 pm per sch msg. [place] per [facility] nurse

## 2017-04-01 NOTE — Progress Notes (Signed)
[place]  Telephone:([phone]) [phone] Fax:([phone]) [phone]  Clinic Follow up Note   Patient Care Team: Rennis Golden as PCP - General (Physician Assistant) 04/05/2017  SUMMARY OF ONCOLOGIC HISTORY: Oncology History   Stage IVA (cT4BcN2cM0) adenocarcinoma of esophagus at 37-41 cm from incisors with intrinsic stenosis and invasion into the middle of mediastinum, encasing and invading the left mainstem bronchus and nodal metastases clinically to the mediastinum, bilateral hilum, and gastrohepatic ligament.     Malignant neoplasm of lower third of esophagus ([place])   01/29/2017 Imaging    CT abd/pelvis: 1. No acute abnormality or change from prior exam. 2. Heterogeneous nodular prostate gland with hyperdense nodularity extending into the bladder base. 3. Moderate hiatal hernia with distal esophageal wall thickening. Wall thickening likely secondary to esophagitis/reflux, cannot exclude esophageal malignancy in the setting of decreased appetite and weight loss. Consider endoscopy.      02/09/2017 Pathology Results    EGD/Colonoscopy by Dr. Laural Golden- One moderate (circumferential scarring or stenosis; an endoscope may pass) malignant-appearing, intrinsic stenosis was found 37 to 41 cm from the incisors. This measured 8 mm (inner diameter) x 4 cm (in length) and was traversed.at the distal and it was polypoidal or masslike. Biopsies were taken with a cold forceps for histology.      02/11/2017 Pathology Results    INVASIVE POORLY DIFFERENTIATED ADENOCARCINOMA.      02/15/2017 Imaging    CT Chest W Contrast 02/15/17 IMPRESSION: 1. There is a large mass involving the mid and distal esophagus. The mass invades the middle mediastinum and encases the left mainstem bronchus. 2. Evidence of mediastinal and bilateral hilar lymph node metastasis. Enlarged gastrohepatic ligament node is also noted. 3. Hiatal hernia.      02/16/2017 Cancer Staging    Cancer Staging Esophageal  cancer [facility]) Staging form: Esophagus - Adenocarcinoma, AJCC 8th Edition - Clinical stage from 02/16/2017: Stage IVA (cT4b, cN2, cM0) - Signed by Baird Cancer, PA-C on 02/16/2017       03/07/2017 Procedure    Placement of G-tube & port-a-cath by IR.       03/22/2017 -  Chemotherapy    Weekly Carbo Taxol with concurrent radiation       03/22/2017 -  Radiation Therapy    Concurrent chemoradiation with Dr. Lisbeth Renshaw       INTERIM HISTORY:  Kurt Fisher is here for a follow up of Esophogeal cancer. He presents to the clinic today with his his ex-wife.   He started noticing trouble swallowing in 08/2016. He then started to throw up regularly and not be able to eat certain things at the end of 11/2016. He say GI doctor in [place] and gave Protonix. He also had a hiatal hernia. He had an EGD in 02/09/17 by dr. Laural Golden.  Before feeding tube he lost about 50 pounds. He is now gaining weight since feeding tube placement. He is doing 2.5 cans a day at the moment and will increase to 6 cans a day. He is starting to feel better overall. He eats liquid like jelly and water and soup and be abel to keep it down. He lives by himself. His energy is better. His pain form the tube placement is still there when he moves certain ways.   He has 2 children 87 and 53 years old. He is on disability due to his mental health. He has a psychiatrist. He is taking Paxil and Seroquel.   His father committed suicide in 2015 and he  had a mental reaction to that where he went to the hospital. He feels he is able to handle chemo diagnosis right now. His father had cancer and his mother had lung cancer at 71. His paternal uncle had brain cancer, his paternal grandmother had lung cancer. He smokes marijuana  as needed. He takes hydrocodone every 6 hours for his pain G tube pain.   CURRENT THERAPY: Concurrent chemoradiation with weekly carbo taxol started 03/22/17.  INTERVAL HISTORY:  Kurt Fisher is here for a follow up and  chemoradiation. He presents to the clinic today with ex-wife. Chemotherapy was held last week on 03/30/17 due to hypotension, dizziness, and fatigue. He received Feraheme that day and IVF on 04/01/17. He is feeling well today. He uses Gtube for bolus feeds, 2 cans osmolite every 4 hours; He tolerates 6 feeds/day. He is not taking much food by mouth. He is drinking well. His BP is improved today, 107/74, HR remains elevated at 108, likely still mild dehydration. Energy level is improved, especially over the weekend. He has mild epigastric pain. PRN pain medication, Hycet, available at home but has not needed today.   REVIEW OF SYSTEMS:   Constitutional: Denies fevers, chills or abnormal weight loss (+) intentional weight gain (+) fatigue, improved Eyes: Denies blurriness of vision Ears, nose, mouth, throat, and face: Denies mucositis or sore throat Respiratory: Denies cough, dyspnea or wheezes Cardiovascular: Denies palpitation, chest discomfort or lower extremity swelling Gastrointestinal:  Denies nausea, heartburn or change in bowel habits (+) G tube soreness/pain upon movement (+) mild intermittent epigastric pain Skin: Denies abnormal skin rashes Lymphatics: Denies new lymphadenopathy or easy bruising Neurological:Denies numbness, tingling or new weaknesses Behavioral/Psych: Mood is stable, no new changes (+) Bipolar, depression, anxiety All other systems were reviewed with the patient and are negative.  MEDICAL HISTORY:  Past Medical History:  Diagnosis Date  . Anxiety   . Bipolar 1 disorder ([place])   . Depression   . Esophageal cancer ([place]) 02/16/2017  . GERD (gastroesophageal reflux disease)    SURGICAL HISTORY: Past Surgical History:  Procedure Laterality Date  . BIOPSY  02/09/2017   Procedure: BIOPSY;  Surgeon: Rogene Houston, MD;  [facility];  Service: Endoscopy;;  gastric/GE Junction/distal esophagus bx   . COLONOSCOPY N/A 02/09/2017   Procedure: COLONOSCOPY;  Surgeon:  Rogene Houston, MD;  Location: [facility];  Service: Endoscopy;  Laterality: N/A;  . ESOPHAGOGASTRODUODENOSCOPY N/A 02/09/2017   Procedure: ESOPHAGOGASTRODUODENOSCOPY (EGD);  Surgeon: Rogene Houston, MD;  Location: [facility];  Service: Endoscopy;  Laterality: N/A;  2:40-moved up to 1255 per Lelon Frohlich  . IR FLUORO GUIDE PORT INSERTION RIGHT  03/07/2017  . IR GASTROSTOMY TUBE MOD SED  03/07/2017  . IR PATIENT EVAL TECH 0-60 MINS  03/18/2017  . IR US GUIDE VASC ACCESS RIGHT  03/07/2017   I have reviewed the social history and family history with the patient and they are unchanged from previous note.  ALLERGIES:  has No Known Allergies.  MEDICATIONS:  Current Outpatient Prescriptions  Medication Sig Dispense Refill  . dexamethasone (DECADRON) 4 MG tablet Take 1 tablet (4 mg total) by mouth 2 (two) times daily with a meal. 20 tablet 0  . emollient (BIAFINE) cream Apply 1 application topically 2 (two) times daily. Apply to chest area after rad tx daily and prn,nothing 4 hours prior to rad txs,    . HYDROcodone-acetaminophen (HYCET) 7.5-325 mg/15 ml solution Take 15 mLs by mouth every 6 (six) hours  as needed for moderate pain. 473 mL 0  . lidocaine-prilocaine (EMLA) cream Apply 1 application topically as needed. Apply to portacath  1 1/2 hours to 2 hours prior to procedures as needed. 30 g 1  . Nutritional Supplements (FEEDING SUPPLEMENT, OSMOLITE 1.5 CAL,) LIQD Give 1 1/2 cans of tube feeding via feeding tube 4 times per day.  Flush tube with 75m of water before and after feeding.   Start with 1/2 can of tube feeding 4 times per day and increase by 1/2 can daily until goal rate of 6 cans per day reached. Send bolus tube feeding supplies. 1422 mL 0  . ondansetron (ZOFRAN) 8 MG tablet Take 1 tablet (8 mg total) by mouth every 8 (eight) hours as needed for nausea or vomiting. 30 tablet 2  . pantoprazole (PROTONIX) 40 MG tablet Take 1 tablet (40 mg total) by mouth 2 (two) times daily before a meal. 60  tablet 3  . PARoxetine (PAXIL) 40 MG tablet Take 40 mg by mouth every evening.     . prochlorperazine (COMPAZINE) 10 MG tablet Take 1 tablet (10 mg total) by mouth every 6 (six) hours as needed for nausea or vomiting. 30 tablet 2  . QUEtiapine (SEROQUEL) 300 MG tablet Take 600 mg by mouth at bedtime.      No current facility-administered medications for this visit.    Facility-Administered Medications Ordered in Other Visits  Medication Dose Route Frequency Provider Last Rate Last Dose  . sodium chloride flush (NS) 0.9 % injection 10 mL  10 mL Intravenous PRN FTruitt Merle MD   10 mL at 04/05/17 1343   PHYSICAL EXAMINATION: ECOG PERFORMANCE STATUS: 1 - Symptomatic but completely ambulatory  Vitals:   04/05/17 0838  BP: 107/74  Pulse: (!) 108  Resp: 18  Temp: 98 F (36.7 C)  SpO2: 100%   Filed Weights   04/05/17 0838  Weight: 157 lb 1.6 oz (71.3 kg)   GENERAL:alert, no distress and comfortable SKIN: skin color, texture, turgor are normal, no rashes or significant lesions EYES: normal, Conjunctiva are pink and non-injected, sclera clear OROPHARYNX:no exudate, no erythema and lips, buccal mucosa, and tongue normal  NECK: supple, thyroid normal size, non-tender, without nodularity LYMPH:  no palpable lymphadenopathy in the cervical, axillary or inguinal LUNGS: clear to auscultation and percussion with normal breathing effort HEART: regular rate & rhythm and no murmurs and no lower extremity edema ABDOMEN:abdomen soft, non-tender and normal bowel sounds, (+) G tube  Musculoskeletal:no cyanosis of digits and no clubbing  NEURO: alert & oriented x 3 with fluent speech, no focal motor/sensory deficits  LABORATORY DATA:  I have reviewed the data as listed CBC Latest Ref Rng & Units 04/05/2017 03/30/2017 03/22/2017  WBC 4.0 - 10.3 10e3/uL 4.1 3.7(L) 8.7  Hemoglobin 13.0 - 17.1 g/dL 10.1(L) 9.4(L) 10.2(L)  Hematocrit 38.4 - 49.9 % 30.9(L) 29.2(L) 30.4(L)  Platelets 140 - 400 10e3/uL 203  273 380    CMP Latest Ref Rng & Units 04/05/2017 03/30/2017 03/22/2017  Glucose 70 - 140 mg/dl 179(H) 134 122  BUN 7.0 - 26.0 mg/dL 21.2 19.1 17.7  Creatinine 0.7 - 1.3 mg/dL 0.7 0.8 0.9  Sodium 136 - 145 mEq/L 135(L) 136 137  Potassium 3.5 - 5.1 mEq/L 3.4(L) 3.6 4.0  Chloride 101 - 111 mmol/L - - -  CO2 22 - 29 mEq/L 27 25 27   Calcium 8.4 - 10.4 mg/dL 9.1 9.3 9.3  Total Protein 6.4 - 8.3 g/dL 6.8 6.9 6.8  Total Bilirubin  0.20 - 1.20 mg/dL <0.22 <0.22 <0.22  Alkaline Phos 40 - 150 U/L 74 78 79  AST 5 - 34 U/L 8 10 10   ALT 0 - 55 U/L 7 8 7    PATHOLOGY   EGD by Dr. Laural Golden 02/09/17 Diagnosis Esophagogastric junction, biopsy - INVASIVE POORLY DIFFERENTIATED ADENOCARCINOMA. Microscopic Comment Dr. Gari Crown has reviewed the case. The case was called to Dr. Laural Golden on 02/11/2017. ADDITIONAL INFORMATION: By immunohistochemistry, the tumor cells are Negative for Her2 (1+).  PROCEDURES  Colonoscopy 02/09/17 IMPRESSION - Preparation of the colon was inadequate. - The rectum, recto-sigmoid colon, sigmoid colon, descending colon, splenic flexure, transverse colon and hepatic flexure are normal. - Examination of ascending colon was very limited because of presence of larormed stool. - External hemorrhoids. - No specimens collected.  RADIOGRAPHIC STUDIES: I have personally reviewed the radiological images as listed and agreed with the findings in the report. No results found.   EGD by Dr. Bernadene Person 02/09/2017 - Normal proximal esophagus and mid esophagus. - 4 cm long distal esophageal stricture secondary to infiltrating lesion felt to be malignant with polypoidal complement at GE junction. Biopsied. - Z-line irregular, 41 cm from the incisors. - 2 cm hiatal hernia. - A small amount of food (residue) in the stomach. - Normal duodenal bulb and second portion of the duodenum  ASSESSMENT & PLAN:  Kurt Fisher  is a 56 y.o. caucasian male with a history of anxiety, GERD, depression, and Bipolar 1  disorder.   1. Adenocarcinoma of the Esophagus, Stage IVA ([id number]) - I reviewed scans and pathology results with pt in detail -We discussed his CT Scan which showed his cancer has spread to his lymph nodes extensively, the tumor directly invades the left bronchus. Due to retroperitoneal node metastasis and placement of cancer this tumor is likely not resectable. I discussed that without surgery the chance of curing his cancer with chemo and RT alone is very lower. I'll present his case in thoracic tumor Board later this week, to see if Dr. Servando Snare would consider surgical resection if he has excellent response to chemotherapy and radiation. -I strongly encouraging him to consider chemotherapy radiation, to shrink his tumor, improve his dysphagia, and potentially cure if surgery can be considered after chemotherapy and irradiation. Dr. Lisbeth Renshaw has seen the patient, and will offer large radiation to cover all his node metastasis. -I previously discussed the chemotherapy options. If he is not a candidate for surgery, I recommended him to consider more intensive chemotherapy FOLFOX with concurrent chemoradiation, followed by 3 cycles of consolidative chemotherapy. Certainly more side effects would be anticipated from this intensive treatment. -I'll tentatively offer weekly carboplatin and Taxol with concurrent radiation, which is a more tolerable chemotherapy regiment, if he tolerates well, I would offer consolidation chemo with FOLFOX for 3 cycles  -After lengthy discussion he is agreeable with weekly carbo and Taxol. -I previously discussed potential side effects and he signed consent for taxol and carboplatin chemotherapy. -The goal of therapy at this point is curative, but if surgery is not offered, this is likely palliative treatment. -Iron low at 34 on 03/15/17; IV Feraheme given 9/5 -Start treatment on 03/21/17 after attending chemo class -chemo held 03/30/17 due to hypotension, dizziness, fatigue;  received Feraheme; IVF on 04/01/17 -He is overall imrpoved; will proceed with Taxol/carbo today -Can set up IVF later this week if he becomes symptomatic but denies dizziness today  2. Weight loss/Nutrician  -Seen by dietician 03/04/17 -Has G Tube placement in 03/07/17 -recently refilled  Hydrocodone once for pain while healing but he will take only as needed for sever pain -Can increase feedings, continue follow up with dietician.   3. Iron deficient anemia -He has moderate anemia, likely related to tumor bleeding.  -Received Feraheme 03/30/17   4. Bipolar -Overall stable, continue medication.  PLAN:  -increase amount/frequency of Gtube feedings -labs reviewed, adequate to proceed with chemo -lab and f/u 1 week (weekly lab, flush, f/u, chemo taxol/carbo)  No orders of the defined types were placed in this encounter.  All questions were answered. The patient knows to call the clinic with any problems, questions or concerns. No barriers to learning was detected.  I spent 20 minutes counseling the patient face to face. The total time spent in the appointment was 25 minutes and more than 50% was on counseling and review of test results    Truitt Merle, MD 04/05/2017   Addendum Patient developed a significant facial, neck and upper chest redness, flushing, 5 minutes after Taxol started. No significant itchiness, dizziness, lip swelling or shortness of breath. Vital signs were stable. Exam showed normal breath sounds bilaterally. This is likely reaction to Taxol. I switched Taxol to Abraxane, starting today.  Truitt Merle  04/05/2017

## 2017-04-01 NOTE — Telephone Encounter (Signed)
Returned pt's call to confirm appt today at [place] to wife Watt Climes at [phone].

## 2017-04-01 NOTE — Patient Instructions (Signed)
Dehydration, Adult Dehydration is when there is not enough fluid or water in your body. This happens when you lose more fluids than you take in. Dehydration can range from mild to very bad. It should be treated right away to keep it from getting very bad. Symptoms of mild dehydration may include:  Thirst.  Dry lips.  Slightly dry mouth.  Dry, warm skin.  Dizziness. Symptoms of moderate dehydration may include:  Very dry mouth.  Muscle cramps.  Dark pee (urine). Pee may be the color of tea.  Your body making less pee.  Your eyes making fewer tears.  Heartbeat that is uneven or faster than normal (palpitations).  Headache.  Light-headedness, especially when you stand up from sitting.  Fainting (syncope). Symptoms of very bad dehydration may include:  Changes in skin, such as: ? Cold and clammy skin. ? Blotchy (mottled) or pale skin. ? Skin that does not quickly return to normal after being lightly pinched and let go (poor skin turgor).  Changes in body fluids, such as: ? Feeling very thirsty. ? Your eyes making fewer tears. ? Not sweating when body temperature is high, such as in hot weather. ? Your body making very little pee.  Changes in vital signs, such as: ? Weak pulse. ? Pulse that is more than 100 beats a minute when you are sitting still. ? Fast breathing. ? Low blood pressure.  Other changes, such as: ? Sunken eyes. ? Cold hands and feet. ? Confusion. ? Lack of energy (lethargy). ? Trouble waking up from sleep. ? Short-term weight loss. ? Unconsciousness. Follow these instructions at home:  If told by your doctor, drink an ORS: ? Make an ORS by using instructions on the package. ? Start by drinking small amounts, about  cup (120 mL) every 5-10 minutes. ? Slowly drink more until you have had the amount that your doctor said to have.  Drink enough clear fluid to keep your pee clear or pale yellow. If you were told to drink an ORS, finish the ORS  first, then start slowly drinking clear fluids. Drink fluids such as: ? Water. Do not drink only water by itself. Doing that can make the salt (sodium) level in your body get too low (hyponatremia). ? Ice chips. ? Fruit juice that you have added water to (diluted). ? Low-calorie sports drinks.  Avoid: ? Alcohol. ? Drinks that have a lot of sugar. These include high-calorie sports drinks, fruit juice that does not have water added, and soda. ? Caffeine. ? Foods that are greasy or have a lot of fat or sugar.  Take over-the-counter and prescription medicines only as told by your doctor.  Do not take salt tablets. Doing that can make the salt level in your body get too high (hypernatremia).  Eat foods that have minerals (electrolytes). Examples include bananas, oranges, potatoes, tomatoes, and spinach.  Keep all follow-up visits as told by your doctor. This is important. Contact a doctor if:  You have belly (abdominal) pain that: ? Gets worse. ? Stays in one area (localizes).  You have a rash.  You have a stiff neck.  You get angry or annoyed more easily than normal (irritability).  You are more sleepy than normal.  You have a harder time waking up than normal.  You feel: ? Weak. ? Dizzy. ? Very thirsty.  You have peed (urinated) only a small amount of very dark pee during 6-8 hours. Get help right away if:  You have symptoms of   very bad dehydration.  You cannot drink fluids without throwing up (vomiting).  Your symptoms get worse with treatment.  You have a fever.  You have a very bad headache.  You are throwing up or having watery poop (diarrhea) and it: ? Gets worse. ? Does not go away.  You have blood or something green (bile) in your throw-up.  You have blood in your poop (stool). This may cause poop to look black and tarry.  You have not peed in 6-8 hours.  You pass out (faint).  Your heart rate when you are sitting still is more than 100 beats a  minute.  You have trouble breathing. This information is not intended to replace advice given to you by your health care provider. Make sure you discuss any questions you have with your health care provider. Document Released: 05/08/2009 Document Revised: 01/30/2016 Document Reviewed: 09/05/2015 Elsevier Interactive Patient Education  2018 [employer]  

## 2017-04-04 ENCOUNTER — Ambulatory Visit
Admission: RE | Admit: 2017-04-04 | Discharge: 2017-04-04 | Disposition: A | Discharge status: Home / Self Care | Payer: Medicare Other | Source: Ambulatory Visit | Attending: Radiation Oncology | Admitting: Radiation Oncology

## 2017-04-04 DIAGNOSIS — F319 Bipolar disorder, unspecified: Secondary | ICD-10-CM | POA: Insufficient documentation

## 2017-04-05 ENCOUNTER — Ambulatory Visit: Payer: No Typology Code available for payment source

## 2017-04-05 ENCOUNTER — Ambulatory Visit (HOSPITAL_BASED_OUTPATIENT_CLINIC_OR_DEPARTMENT_OTHER): Payer: No Typology Code available for payment source | Admitting: Hematology

## 2017-04-05 ENCOUNTER — Ambulatory Visit
Admission: RE | Admit: 2017-04-05 | Discharge: 2017-04-05 | Disposition: A | Discharge status: Home / Self Care | Payer: Medicare Other | Source: Ambulatory Visit | Attending: Radiation Oncology | Admitting: Radiation Oncology

## 2017-04-05 ENCOUNTER — Encounter: Payer: Self-pay | Admitting: Hematology

## 2017-04-05 ENCOUNTER — Other Ambulatory Visit (HOSPITAL_BASED_OUTPATIENT_CLINIC_OR_DEPARTMENT_OTHER): Payer: No Typology Code available for payment source

## 2017-04-05 ENCOUNTER — Ambulatory Visit (HOSPITAL_BASED_OUTPATIENT_CLINIC_OR_DEPARTMENT_OTHER): Payer: No Typology Code available for payment source

## 2017-04-05 VITALS — BP 107/74 | HR 108 | Temp 98.0°F | Resp 18 | Ht 76.0 in | Wt 157.1 lb

## 2017-04-05 DIAGNOSIS — C155 Malignant neoplasm of lower third of esophagus: Secondary | ICD-10-CM | POA: Diagnosis not present

## 2017-04-05 DIAGNOSIS — R195 Other fecal abnormalities: Secondary | ICD-10-CM

## 2017-04-05 DIAGNOSIS — C154 Malignant neoplasm of middle third of esophagus: Secondary | ICD-10-CM

## 2017-04-05 DIAGNOSIS — F319 Bipolar disorder, unspecified: Secondary | ICD-10-CM

## 2017-04-05 DIAGNOSIS — D509 Iron deficiency anemia, unspecified: Secondary | ICD-10-CM

## 2017-04-05 DIAGNOSIS — Z5111 Encounter for antineoplastic chemotherapy: Secondary | ICD-10-CM | POA: Diagnosis not present

## 2017-04-05 LAB — CBC WITH DIFFERENTIAL/PLATELET
BASO%: 0 % (ref 0.0–2.0)
Basophils Absolute: 0 10*3/uL (ref 0.0–0.1)
EOS%: 0 % (ref 0.0–7.0)
Eosinophils Absolute: 0 10*3/uL (ref 0.0–0.5)
HCT: 30.9 % — ABNORMAL LOW (ref 38.4–49.9)
HGB: 10.1 g/dL — ABNORMAL LOW (ref 13.0–17.1)
LYMPH%: 11.2 % — AB (ref 14.0–49.0)
MCH: 28.4 pg (ref 27.2–33.4)
MCHC: 32.7 g/dL (ref 32.0–36.0)
MCV: 86.8 fL (ref 79.3–98.0)
MONO#: 0.2 10*3/uL (ref 0.1–0.9)
MONO%: 3.6 % (ref 0.0–14.0)
NEUT%: 85.2 % — AB (ref 39.0–75.0)
NEUTROS ABS: 3.5 10*3/uL (ref 1.5–6.5)
Platelets: 203 10*3/uL (ref 140–400)
RBC: 3.56 10*6/uL — AB (ref 4.20–5.82)
RDW: 17.1 % — ABNORMAL HIGH (ref 11.0–14.6)
WBC: 4.1 10*3/uL (ref 4.0–10.3)
lymph#: 0.5 10*3/uL — ABNORMAL LOW (ref 0.9–3.3)

## 2017-04-05 LAB — COMPREHENSIVE METABOLIC PANEL
ALT: 7 U/L (ref 0–55)
AST: 8 U/L (ref 5–34)
Albumin: 3.2 g/dL — ABNORMAL LOW (ref 3.5–5.0)
Alkaline Phosphatase: 74 U/L (ref 40–150)
Anion Gap: 8 mEq/L (ref 3–11)
BUN: 21.2 mg/dL (ref 7.0–26.0)
CHLORIDE: 100 meq/L (ref 98–109)
CO2: 27 mEq/L (ref 22–29)
CREATININE: 0.7 mg/dL (ref 0.7–1.3)
Calcium: 9.1 mg/dL (ref 8.4–10.4)
EGFR: >90 mL/min/{1.73_m2} (ref >90)
GLUCOSE: 179 mg/dL — AB (ref 70–140)
Potassium: 3.4 mEq/L — ABNORMAL LOW (ref 3.5–5.1)
SODIUM: 135 meq/L — AB (ref 136–145)
TOTAL PROTEIN: 6.8 g/dL (ref 6.4–8.3)
Total Bilirubin: <0.22 mg/dL (ref 0.20–1.20)

## 2017-04-05 LAB — CEA (IN HOUSE-CHCC): CEA (CHCC-IN HOUSE): 36.68 ng/mL — AB (ref 0.00–5.00)

## 2017-04-05 MED ORDER — SODIUM CHLORIDE 0.9% FLUSH
10.0000 mL | INTRAVENOUS | Status: DC | PRN
  Filled 2017-04-05: qty 10

## 2017-04-05 MED ORDER — FAMOTIDINE IN NACL 20-0.9 MG/50ML-% IV SOLN
20.0000 mg | Freq: Once | INTRAVENOUS | Status: AC
  Administered 2017-04-05: 20 mg via INTRAVENOUS

## 2017-04-05 MED ORDER — SODIUM CHLORIDE 0.9% FLUSH
10.0000 mL | INTRAVENOUS | Status: DC | PRN
  Administered 2017-04-05 (×2): 10 mL via INTRAVENOUS
  Filled 2017-04-05: qty 10

## 2017-04-05 MED ORDER — METHYLPREDNISOLONE SODIUM SUCC 125 MG IJ SOLR
125.0000 mg | Freq: Once | INTRAMUSCULAR | Status: AC | PRN
  Administered 2017-04-05: 60 mg via INTRAVENOUS

## 2017-04-05 MED ORDER — HEPARIN SOD (PORK) LOCK FLUSH 100 UNIT/ML IV SOLN
500.0000 [IU] | Freq: Once | INTRAVENOUS | Status: AC | PRN
  Administered 2017-04-05: 500 [IU]
  Filled 2017-04-05: qty 5

## 2017-04-05 MED ORDER — FAMOTIDINE IN NACL 20-0.9 MG/50ML-% IV SOLN
INTRAVENOUS | Status: AC
  Filled 2017-04-05: qty 50

## 2017-04-05 MED ORDER — DIPHENHYDRAMINE HCL 50 MG/ML IJ SOLN
INTRAMUSCULAR | Status: AC
  Filled 2017-04-05: qty 1

## 2017-04-05 MED ORDER — SODIUM CHLORIDE 0.9 % IV SOLN
258.8000 mg | Freq: Once | INTRAVENOUS | Status: AC
  Administered 2017-04-05: 260 mg via INTRAVENOUS
  Filled 2017-04-05: qty 26

## 2017-04-05 MED ORDER — PALONOSETRON HCL INJECTION 0.25 MG/5ML
0.2500 mg | Freq: Once | INTRAVENOUS | Status: AC
  Administered 2017-04-05: 0.25 mg via INTRAVENOUS

## 2017-04-05 MED ORDER — SODIUM CHLORIDE 0.9 % IV SOLN
Freq: Once | INTRAVENOUS | Status: AC
  Administered 2017-04-05: 09:00:00 via INTRAVENOUS

## 2017-04-05 MED ORDER — METHYLPREDNISOLONE SODIUM SUCC 125 MG IJ SOLR
INTRAMUSCULAR | Status: AC
  Filled 2017-04-05: qty 2

## 2017-04-05 MED ORDER — PACLITAXEL CHEMO INJECTION 300 MG/50ML
50.0000 mg/m2 | Freq: Once | INTRAVENOUS | Status: DC
  Administered 2017-04-05: 96 mg via INTRAVENOUS
  Filled 2017-04-05: qty 16

## 2017-04-05 MED ORDER — PALONOSETRON HCL INJECTION 0.25 MG/5ML
INTRAVENOUS | Status: AC
  Filled 2017-04-05: qty 5

## 2017-04-05 MED ORDER — DEXAMETHASONE SODIUM PHOSPHATE 10 MG/ML IJ SOLN
10.0000 mg | Freq: Once | INTRAMUSCULAR | Status: AC
  Administered 2017-04-05: 10 mg via INTRAVENOUS

## 2017-04-05 MED ORDER — DEXAMETHASONE SODIUM PHOSPHATE 10 MG/ML IJ SOLN
INTRAMUSCULAR | Status: AC
  Filled 2017-04-05: qty 1

## 2017-04-05 MED ORDER — PACLITAXEL PROTEIN-BOUND CHEMO INJECTION 100 MG
94.0000 mg | Freq: Once | INTRAVENOUS | Status: AC
  Administered 2017-04-05: 100 mg via INTRAVENOUS
  Filled 2017-04-05: qty 20

## 2017-04-05 MED ORDER — DIPHENHYDRAMINE HCL 50 MG/ML IJ SOLN
50.0000 mg | Freq: Once | INTRAMUSCULAR | Status: AC
  Administered 2017-04-05: 50 mg via INTRAVENOUS

## 2017-04-05 NOTE — Patient Instructions (Signed)
Implanted Port Home Guide An implanted port is a type of central line that is placed under the skin. Central lines are used to provide IV access when treatment or nutrition needs to be given through a person's veins. Implanted ports are used for long-term IV access. An implanted port may be placed because:  You need IV medicine that would be irritating to the small veins in your hands or arms.  You need long-term IV medicines, such as antibiotics.  You need IV nutrition for a long period.  You need frequent blood draws for lab tests.  You need dialysis.  Implanted ports are usually placed in the chest area, but they can also be placed in the upper arm, the abdomen, or the leg. An implanted port has two main parts:  Reservoir. The reservoir is round and will appear as a small, raised area under your skin. The reservoir is the part where a needle is inserted to give medicines or draw blood.  Catheter. The catheter is a thin, flexible tube that extends from the reservoir. The catheter is placed into a large vein. Medicine that is inserted into the reservoir goes into the catheter and then into the vein.  How will I care for my incision site? Do not get the incision site wet. Bathe or shower as directed by your health care provider. How is my port accessed? Special steps must be taken to access the port:  Before the port is accessed, a numbing cream can be placed on the skin. This helps numb the skin over the port site.  Your health care provider uses a sterile technique to access the port. ? Your health care provider must put on a mask and sterile gloves. ? The skin over your port is cleaned carefully with an antiseptic and allowed to dry. ? The port is gently pinched between sterile gloves, and a needle is inserted into the port.  Only "non-coring" port needles should be used to access the port. Once the port is accessed, a blood return should be checked. This helps ensure that the port  is in the vein and is not clogged.  If your port needs to remain accessed for a constant infusion, a clear (transparent) bandage will be placed over the needle site. The bandage and needle will need to be changed every week, or as directed by your health care provider.  Keep the bandage covering the needle clean and dry. Do not get it wet. Follow your health care provider's instructions on how to take a shower or bath while the port is accessed.  If your port does not need to stay accessed, no bandage is needed over the port.  What is flushing? Flushing helps keep the port from getting clogged. Follow your health care provider's instructions on how and when to flush the port. Ports are usually flushed with saline solution or a medicine called heparin. The need for flushing will depend on how the port is used.  If the port is used for intermittent medicines or blood draws, the port will need to be flushed: ? After medicines have been given. ? After blood has been drawn. ? As part of routine maintenance.  If a constant infusion is running, the port may not need to be flushed.  How long will my port stay implanted? The port can stay in for as long as your health care provider thinks it is needed. When it is time for the port to come out, surgery will be   done to remove it. The procedure is similar to the one performed when the port was put in. When should I seek immediate medical care? When you have an implanted port, you should seek immediate medical care if:  You notice a bad smell coming from the incision site.  You have swelling, redness, or drainage at the incision site.  You have more swelling or pain at the port site or the surrounding area.  You have a fever that is not controlled with medicine.  This information is not intended to replace advice given to you by your health care provider. Make sure you discuss any questions you have with your health care provider. Document  Released: 07/12/2005 Document Revised: 12/18/2015 Document Reviewed: 03/19/2013 Elsevier Interactive Patient Education  2017 [employer]  

## 2017-04-05 NOTE — Progress Notes (Signed)
Pt reacted to Taxol after 17.4 mL infused per Royston Sinner, RN. Dr. Burr Medico requested change to Abraxane 50 mg/m2 and deduct the amt of Taxol pt received (= 6.3 mg).  Today's dose of Abraxane is 94 mg. Pt self-pay and we will begin process for trying to obtain pt assistance for Abraxane. Kennith Center, Pharm.D., CPP 04/05/2017@11 :27 AM

## 2017-04-05 NOTE — Patient Instructions (Signed)

## 2017-04-06 ENCOUNTER — Ambulatory Visit
Admission: RE | Admit: 2017-04-06 | Discharge: 2017-04-06 | Disposition: A | Discharge status: Home / Self Care | Payer: Medicare Other | Source: Ambulatory Visit | Attending: Radiation Oncology | Admitting: Radiation Oncology

## 2017-04-06 ENCOUNTER — Telehealth: Payer: Self-pay | Admitting: Hematology

## 2017-04-06 NOTE — Telephone Encounter (Signed)
Per 9/11 no los

## 2017-04-06 NOTE — Telephone Encounter (Signed)
No 9/5 los

## 2017-04-06 NOTE — Progress Notes (Signed)
Patient received 17.4 cc of 1 hour Taxol infusion, complained of flushing, nausea, lightheaded.  Patient visibly flushed.  Infusion stopped and NS IV started.  Dr Burr Medico to chair side and Solumedrol 60mg  given IV.  Patient described "feeling better"  Flushing subsiding.  Continue on NS IV.  Stop Taxol and will receive Abraxane in future treatments.

## 2017-04-07 ENCOUNTER — Ambulatory Visit
Admission: RE | Admit: 2017-04-07 | Discharge: 2017-04-07 | Disposition: A | Discharge status: Home / Self Care | Payer: Medicare Other | Source: Ambulatory Visit | Attending: Radiation Oncology | Admitting: Radiation Oncology

## 2017-04-08 ENCOUNTER — Ambulatory Visit: Payer: Medicare Other

## 2017-04-08 ENCOUNTER — Encounter: Payer: Self-pay | Admitting: Pharmacy Technician

## 2017-04-08 NOTE — Progress Notes (Signed)
The patient is approved for drug assistance by [employer] for Abraxane. Coverage is from 04/08/17 - 9/14-19. Enrollment is based on Self-Pay. The first DOS covered is 04/05/17.

## 2017-04-08 NOTE — Progress Notes (Signed)
[place]  [phone] [phone]  Clinic Follow up Note   Patient Care Team: Rennis Golden as PCP - General (Physician Assistant) 04/11/2017  SUMMARY OF ONCOLOGIC HISTORY: Oncology History   Stage IVA (cT4BcN2cM0) adenocarcinoma of esophagus at 37-41 cm from incisors with intrinsic stenosis and invasion into the middle of mediastinum, encasing and invading the left mainstem bronchus and nodal metastases clinically to the mediastinum, bilateral hilum, and gastrohepatic ligament.     Malignant neoplasm of lower third of esophagus ([place])   01/29/2017 Imaging    CT abd/pelvis: 1. No acute abnormality or change from prior exam. 2. Heterogeneous nodular prostate gland with hyperdense nodularity extending into the bladder base. 3. Moderate hiatal hernia with distal esophageal wall thickening. Wall thickening likely secondary to esophagitis/reflux, cannot exclude esophageal malignancy in the setting of decreased appetite and weight loss. Consider endoscopy.      02/09/2017 Pathology Results    EGD/Colonoscopy by Dr. Laural Golden- One moderate (circumferential scarring or stenosis; an endoscope may pass) malignant-appearing, intrinsic stenosis was found 37 to 41 cm from the incisors. This measured 8 mm (inner diameter) x 4 cm (in length) and was traversed.at the distal and it was polypoidal or masslike. Biopsies were taken with a cold forceps for histology.      02/11/2017 Pathology Results    INVASIVE POORLY DIFFERENTIATED ADENOCARCINOMA.      02/15/2017 Imaging    CT Chest W Contrast 02/15/17 IMPRESSION: 1. There is a large mass involving the mid and distal esophagus. The mass invades the middle mediastinum and encases the left mainstem bronchus. 2. Evidence of mediastinal and bilateral hilar lymph node metastasis. Enlarged gastrohepatic ligament node is also noted. 3. Hiatal hernia.      02/16/2017 Cancer Staging    Cancer Staging Esophageal  cancer [facility]) Staging form: Esophagus - Adenocarcinoma, AJCC 8th Edition - Clinical stage from 02/16/2017: Stage IVA (cT4b, cN2, cM0) - Signed by Baird Cancer, PA-C on 02/16/2017       03/07/2017 Procedure    Placement of G-tube & port-a-cath by IR.       03/22/2017 -  Chemotherapy    Weekly Carbo Taxol with concurrent radiation       03/22/2017 -  Radiation Therapy    Concurrent chemoradiation with Dr. Lisbeth Renshaw       INTERIM HISTORY:  Kurt Fisher is here for a follow up of Esophogeal cancer. He presents to the clinic today with his his ex-wife.   He started noticing trouble swallowing in 08/2016. He then started to throw up regularly and not be able to eat certain things at the end of 11/2016. He say GI doctor in [place] and gave Protonix. He also had a hiatal hernia. He had an EGD in 02/09/17 by dr. Laural Golden.  Before feeding tube he lost about 50 pounds. He is now gaining weight since feeding tube placement. He is doing 2.5 cans a day at the moment and will increase to 6 cans a day. He is starting to feel better overall. He eats liquid like jelly and water and soup and be abel to keep it down. He lives by himself. His energy is better. His pain form the tube placement is still there when he moves certain ways.   He has 2 children 76 and 22 years old. He is on disability due to his mental health. He has a psychiatrist. He is taking Paxil and Seroquel.   His father committed suicide in 2015 and he  had a mental reaction to that where he went to the hospital. He feels he is able to handle chemo diagnosis right now. His father had cancer and his mother had lung cancer at 26. His paternal uncle had brain cancer, his paternal grandmother had lung cancer. He smokes marijuana  as needed. He takes hydrocodone every 6 hours for his pain G tube pain.   CURRENT THERAPY: Concurrent chemoradiation with weekly carbo taxol started 03/22/17, taxol changed to Abraxane due to significant reaction to Taxol,  starting 04/05/17.  INTERVAL HISTORY:  Kurt Fisher is here for a follow up and chemoradiation. He presents to the clinic today with ex-wife.   He reports to feeling lightheadedness for the last few days. He presents in a wheelchair. He uses feeding tube with 6 cans a day and water 120 cc 3 times. He is drinking by mouth 2-3 bottles a day.  His BP is 86/56. His ex-wife says he fell a few night ago. He had nausea with no vomiting. He takes nausea medication. He has pain in abdomen and is almost out of HYCET. He takes it as needed 3 times a day.     REVIEW OF SYSTEMS:   Constitutional: Denies fevers, chills or abnormal weight loss (+) intentional weight gain (+) fatigue, improved (+) lightheadedness Eyes: Denies blurriness of vision Ears, nose, mouth, throat, and face: Denies mucositis or sore throat Respiratory: Denies cough, dyspnea or wheezes Cardiovascular: Denies palpitation, chest discomfort or lower extremity swelling Gastrointestinal:  Denies nausea, heartburn or change in bowel habits (+) G tube soreness/pain upon movement (+) increased intermittent epigastric pain Skin: Denies abnormal skin rashes Lymphatics: Denies new lymphadenopathy or easy bruising Neurological:Denies numbness, tingling or new weaknesses Behavioral/Psych: Mood is stable, no new changes (+) Bipolar, depression, anxiety All other systems were reviewed with the patient and are negative.  MEDICAL HISTORY:  Past Medical History:  Diagnosis Date  . Anxiety   . Bipolar 1 disorder ([place])   . Depression   . Esophageal cancer ([place]) 02/16/2017  . GERD (gastroesophageal reflux disease)    SURGICAL HISTORY: Past Surgical History:  Procedure Laterality Date  . BIOPSY  02/09/2017   Procedure: BIOPSY;  Surgeon: Rogene Houston, MD;  Location: [facility];  Service: Endoscopy;;  gastric/GE Junction/distal esophagus bx   . COLONOSCOPY N/A 02/09/2017   Procedure: COLONOSCOPY;  Surgeon: Rogene Houston, MD;  Location:  [facility];  Service: Endoscopy;  Laterality: N/A;  . ESOPHAGOGASTRODUODENOSCOPY N/A 02/09/2017   Procedure: ESOPHAGOGASTRODUODENOSCOPY (EGD);  Surgeon: Rogene Houston, MD;  Location: [facility];  Service: Endoscopy;  Laterality: N/A;  2:40-moved up to 1255 per Lelon Frohlich  . IR FLUORO GUIDE PORT INSERTION RIGHT  03/07/2017  . IR GASTROSTOMY TUBE MOD SED  03/07/2017  . IR PATIENT EVAL TECH 0-60 MINS  03/18/2017  . IR US GUIDE VASC ACCESS RIGHT  03/07/2017   I have reviewed the social history and family history with the patient and they are unchanged from previous note.  ALLERGIES:  is allergic to taxol [paclitaxel].  MEDICATIONS:  Current Outpatient Prescriptions  Medication Sig Dispense Refill  . dexamethasone (DECADRON) 4 MG tablet Take 1 tablet (4 mg total) by mouth 2 (two) times daily with a meal. 20 tablet 0  . emollient (BIAFINE) cream Apply 1 application topically 2 (two) times daily. Apply to chest area after rad tx daily and prn,nothing 4 hours prior to rad txs,    . HYDROcodone-acetaminophen (HYCET) 7.5-325 mg/15 ml solution Take 15  mLs by mouth every 6 (six) hours as needed for moderate pain. 473 mL 0  . lidocaine-prilocaine (EMLA) cream Apply 1 application topically as needed. Apply to portacath  1 1/2 hours to 2 hours prior to procedures as needed. 30 g 1  . Nutritional Supplements (FEEDING SUPPLEMENT, OSMOLITE 1.5 CAL,) LIQD Give 1 1/2 cans of tube feeding via feeding tube 4 times per day.  Flush tube with 29m of water before and after feeding.   Start with 1/2 can of tube feeding 4 times per day and increase by 1/2 can daily until goal rate of 6 cans per day reached. Send bolus tube feeding supplies. 1422 mL 0  . ondansetron (ZOFRAN) 8 MG tablet Take 1 tablet (8 mg total) by mouth every 8 (eight) hours as needed for nausea or vomiting. 30 tablet 2  . pantoprazole (PROTONIX) 40 MG tablet Take 1 tablet (40 mg total) by mouth 2 (two) times daily before a meal. 60 tablet 3  .  PARoxetine (PAXIL) 40 MG tablet Take 40 mg by mouth every evening.     . prochlorperazine (COMPAZINE) 10 MG tablet Take 1 tablet (10 mg total) by mouth every 6 (six) hours as needed for nausea or vomiting. 30 tablet 2  . QUEtiapine (SEROQUEL) 300 MG tablet Take 600 mg by mouth at bedtime.      No current facility-administered medications for this visit.    Facility-Administered Medications Ordered in Other Visits  Medication Dose Route Frequency Provider Last Rate Last Dose  . sodium chloride flush (NS) 0.9 % injection 10 mL  10 mL Intravenous PRN FTruitt Merle MD   10 mL at 04/05/17 1343   PHYSICAL EXAMINATION: ECOG PERFORMANCE STATUS: 2-3  Vitals:   04/11/17 0916  BP: (!) 86/56  Pulse: 100  Resp: 16  Temp: 98.2 F (36.8 C)  SpO2: 100%   Filed Weights   04/11/17 0916  Weight: 156 lb 12.8 oz (71.1 kg)     GENERAL:alert, no distress and comfortable SKIN: skin color, texture, turgor are normal, no rashes or significant lesions EYES: normal, Conjunctiva are pink and non-injected, sclera clear OROPHARYNX:no exudate, no erythema and lips, buccal mucosa, and tongue normal  NECK: supple, thyroid normal size, non-tender, without nodularity LYMPH:  no palpable lymphadenopathy in the cervical, axillary or inguinal LUNGS: clear to auscultation and percussion with normal breathing effort HEART: regular rate & rhythm and no murmurs and no lower extremity edema ABDOMEN:abdomen soft, non-tender and normal bowel sounds, (+) G tube  Musculoskeletal:no cyanosis of digits and no clubbing  NEURO: alert & oriented x 3 with fluent speech, no focal motor/sensory deficits  LABORATORY DATA:  I have reviewed the data as listed CBC Latest Ref Rng & Units 04/11/2017 04/05/2017 03/30/2017  WBC 4.0 - 10.3 10e3/uL 5.2 4.1 3.7(L)  Hemoglobin 13.0 - 17.1 g/dL 9.8(L) 10.1(L) 9.4(L)  Hematocrit 38.4 - 49.9 % 29.7(L) 30.9(L) 29.2(L)  Platelets 140 - 400 10e3/uL 151 203 273    CMP Latest Ref Rng & Units  04/11/2017 04/05/2017 03/30/2017  Glucose 70 - 140 mg/dl 151(H) 179(H) 134  BUN 7.0 - 26.0 mg/dL 15.8 21.2 19.1  Creatinine 0.7 - 1.3 mg/dL 0.7 0.7 0.8  Sodium 136 - 145 mEq/L 135(L) 135(L) 136  Potassium 3.5 - 5.1 mEq/L 3.9 3.4(L) 3.6  Chloride 101 - 111 mmol/L - - -  CO2 22 - 29 mEq/L 28 27 25   Calcium 8.4 - 10.4 mg/dL 8.8 9.1 9.3  Total Protein 6.4 - 8.3 g/dL 6.2(L) 6.8  6.9  Total Bilirubin 0.20 - 1.20 mg/dL <0.22 <0.22 <0.22  Alkaline Phos 40 - 150 U/L 72 74 78  AST 5 - 34 U/L 13 8 10   ALT 0 - 55 U/L 10 7 8    PATHOLOGY   EGD by Dr. Laural Golden 02/09/17 Diagnosis Esophagogastric junction, biopsy - INVASIVE POORLY DIFFERENTIATED ADENOCARCINOMA. Microscopic Comment Dr. Gari Crown has reviewed the case. The case was called to Dr. Laural Golden on 02/11/2017. ADDITIONAL INFORMATION: By immunohistochemistry, the tumor cells are Negative for Her2 (1+).  PROCEDURES  Colonoscopy 02/09/17 IMPRESSION - Preparation of the colon was inadequate. - The rectum, recto-sigmoid colon, sigmoid colon, descending colon, splenic flexure, transverse colon and hepatic flexure are normal. - Examination of ascending colon was very limited because of presence of larormed stool. - External hemorrhoids. - No specimens collected.  RADIOGRAPHIC STUDIES: I have personally reviewed the radiological images as listed and agreed with the findings in the report. No results found.   EGD by Dr. Bernadene Person 02/09/2017 - Normal proximal esophagus and mid esophagus. - 4 cm long distal esophageal stricture secondary to infiltrating lesion felt to be malignant with polypoidal complement at GE junction. Biopsied. - Z-line irregular, 41 cm from the incisors. - 2 cm hiatal hernia. - A small amount of food (residue) in the stomach. - Normal duodenal bulb and second portion of the duodenum  ASSESSMENT & PLAN:  Kurt Fisher  is a 56 y.o. caucasian male with a history of anxiety, GERD, depression, and Bipolar 1 disorder.   1. Adenocarcinoma  of the Esophagus, Stage IVA ([id number]) - I reviewed scans and pathology results with pt in detail -We discussed his CT Scan which showed his cancer has spread to his lymph nodes extensively, the tumor directly invades the left bronchus. Due to retroperitoneal node metastasis and placement of cancer this tumor is likely not resectable. I discussed that without surgery the chance of curing his cancer with chemo and RT alone is very lower. I'll present his case in thoracic tumor Board later this week, to see if Dr. Servando Snare would consider surgical resection if he has excellent response to chemotherapy and radiation. -I strongly encouraging him to consider chemotherapy radiation, to shrink his tumor, improve his dysphagia, and potentially cure if surgery can be considered after chemotherapy and irradiation. Dr. Lisbeth Renshaw has seen the patient, and will offer large radiation to cover all his node metastasis. -I previously discussed the chemotherapy options. If he is not a candidate for surgery, I recommended him to consider more intensive chemotherapy FOLFOX with concurrent chemoradiation, followed by 3 cycles of consolidative chemotherapy. Certainly more side effects would be anticipated from this intensive treatment. -I'll tentatively offer weekly carboplatin and Taxol with concurrent radiation, which is a more tolerable chemotherapy regiment, if he tolerates well, I would offer consolidation chemo with FOLFOX for 3 cycles  -After lengthy discussion he is agreeable with weekly carbo and Taxol. -The goal of therapy at this point is curative, but if surgery is not offered, this is likely palliative treatment. -Iron low at 34 on 03/15/17; IV Feraheme given 9/5 -Start treatment on 03/21/17 after attending chemo class -chemo held 03/30/17 due to hypotension, dizziness, fatigue; received Feraheme; IVF on 04/01/17 -switched Taxol to Abraxane starting 04/05/17 because of infusion reaction to Taxol -he continue to have  intermittent borderline hypotension. Will give IVF today and later this week to help recover. I encourage him to drink more water. His BP is 86/56 today so he should increase feeding. Labs otherwise fine to continue  treatment. -Will repeat scan after completion of chemoradiation to further evaluate his following treatment.  -F/u next week   2. Weight loss/Nutrician  -Seen by dietician 03/04/17 -Has G Tube placement in 03/07/17 -recently refilled Hydrocodone once for pain while healing but he will take only as needed for sever pain -Can increase feedings, continue follow up with dietician.  -His weight has been stable and will f/u with dietician.  -I suggest he increase feeding to 8 cans a day.   3. Iron deficient anemia -He has moderate anemia, likely related to tumor bleeding.  -Received Feraheme 03/30/17 -HG is 9.8, no need for transfusion. Iron study still pending.   4. Bipolar -Overall stable, continue medication.  5. Borderline hypotension -He is not on blood pressure medication. This is likely related to chemotherapy and irradiation, and insufficient oral intake -1L NS today and later this week  -I will ask rad/onc to monitor his VS daily before radiation   PLAN:  -Refill Hyet today  -increase tube feeding to 8 cans a day  -!L NS IVF today and 2 hrLVF this Friday 9/21 -increase amount/frequency of Gtube feedings to 8 cans daily -labs reviewed, adequate to proceed with chemo -lab and f/u 1 week (weekly lab, flush, f/u, chemo taxol/carbo)  Orders Placed This Encounter  Procedures  . CEA    Standing Status:   Standing    Number of Occurrences:   30    Standing Expiration Date:   04/10/2022   All questions were answered. The patient knows to call the clinic with any problems, questions or concerns. No barriers to learning was detected.  I spent 20 minutes counseling the patient face to face. The total time spent in the appointment was 25 minutes and more than 50% was on  counseling and review of test results  This document serves as a record of services personally performed by Truitt Merle, MD. It was created on her behalf by Joslyn Devon, a trained medical scribe. The creation of this record is based on the scribe's personal observations and the provider's statements to them. This document has been checked and approved by the attending provider.     Truitt Merle, MD 04/11/2017

## 2017-04-11 ENCOUNTER — Ambulatory Visit (HOSPITAL_BASED_OUTPATIENT_CLINIC_OR_DEPARTMENT_OTHER): Payer: No Typology Code available for payment source

## 2017-04-11 ENCOUNTER — Encounter: Payer: Self-pay | Admitting: *Deleted

## 2017-04-11 ENCOUNTER — Ambulatory Visit
Admission: RE | Admit: 2017-04-11 | Discharge: 2017-04-11 | Disposition: A | Discharge status: Home / Self Care | Payer: Medicare Other | Source: Ambulatory Visit | Attending: Radiation Oncology | Admitting: Radiation Oncology

## 2017-04-11 ENCOUNTER — Other Ambulatory Visit (HOSPITAL_BASED_OUTPATIENT_CLINIC_OR_DEPARTMENT_OTHER): Payer: No Typology Code available for payment source

## 2017-04-11 ENCOUNTER — Ambulatory Visit (HOSPITAL_BASED_OUTPATIENT_CLINIC_OR_DEPARTMENT_OTHER): Payer: No Typology Code available for payment source | Admitting: Hematology

## 2017-04-11 ENCOUNTER — Ambulatory Visit: Payer: No Typology Code available for payment source

## 2017-04-11 VITALS — BP 111/79 | HR 81 | Resp 16

## 2017-04-11 VITALS — BP 86/56 | HR 100 | Temp 98.2°F | Resp 16 | Ht 76.0 in | Wt 156.8 lb

## 2017-04-11 DIAGNOSIS — C155 Malignant neoplasm of lower third of esophagus: Secondary | ICD-10-CM

## 2017-04-11 DIAGNOSIS — F319 Bipolar disorder, unspecified: Secondary | ICD-10-CM

## 2017-04-11 DIAGNOSIS — C154 Malignant neoplasm of middle third of esophagus: Secondary | ICD-10-CM

## 2017-04-11 DIAGNOSIS — D509 Iron deficiency anemia, unspecified: Secondary | ICD-10-CM

## 2017-04-11 DIAGNOSIS — R195 Other fecal abnormalities: Secondary | ICD-10-CM

## 2017-04-11 DIAGNOSIS — Z5111 Encounter for antineoplastic chemotherapy: Secondary | ICD-10-CM | POA: Diagnosis not present

## 2017-04-11 LAB — CEA (IN HOUSE-CHCC): CEA (CHCC-In House): 28.95 ng/mL — ABNORMAL HIGH (ref 0.00–5.00)

## 2017-04-11 LAB — CBC WITH DIFFERENTIAL/PLATELET
BASO%: 0.2 % (ref 0.0–2.0)
BASOS ABS: 0 10*3/uL (ref 0.0–0.1)
EOS%: 0.8 % (ref 0.0–7.0)
Eosinophils Absolute: 0 10*3/uL (ref 0.0–0.5)
HCT: 29.7 % — ABNORMAL LOW (ref 38.4–49.9)
HGB: 9.8 g/dL — ABNORMAL LOW (ref 13.0–17.1)
LYMPH%: 3.7 % — AB (ref 14.0–49.0)
MCH: 28.9 pg (ref 27.2–33.4)
MCHC: 33 g/dL (ref 32.0–36.0)
MCV: 87.6 fL (ref 79.3–98.0)
MONO#: 0.4 10*3/uL (ref 0.1–0.9)
MONO%: 7.9 % (ref 0.0–14.0)
NEUT#: 4.5 10*3/uL (ref 1.5–6.5)
NEUT%: 87.4 % — AB (ref 39.0–75.0)
Platelets: 151 10*3/uL (ref 140–400)
RBC: 3.39 10*6/uL — AB (ref 4.20–5.82)
RDW: 17.9 % — ABNORMAL HIGH (ref 11.0–14.6)
WBC: 5.2 10*3/uL (ref 4.0–10.3)
lymph#: 0.2 10*3/uL — ABNORMAL LOW (ref 0.9–3.3)

## 2017-04-11 LAB — COMPREHENSIVE METABOLIC PANEL
ALT: 10 U/L (ref 0–55)
AST: 13 U/L (ref 5–34)
Albumin: 2.9 g/dL — ABNORMAL LOW (ref 3.5–5.0)
Alkaline Phosphatase: 72 U/L (ref 40–150)
Anion Gap: 7 mEq/L (ref 3–11)
BUN: 15.8 mg/dL (ref 7.0–26.0)
CALCIUM: 8.8 mg/dL (ref 8.4–10.4)
CHLORIDE: 101 meq/L (ref 98–109)
CO2: 28 mEq/L (ref 22–29)
CREATININE: 0.7 mg/dL (ref 0.7–1.3)
EGFR: >90 mL/min/{1.73_m2} (ref >90)
Glucose: 151 mg/dl — ABNORMAL HIGH (ref 70–140)
POTASSIUM: 3.9 meq/L (ref 3.5–5.1)
Sodium: 135 mEq/L — ABNORMAL LOW (ref 136–145)
Total Bilirubin: <0.22 mg/dL (ref 0.20–1.20)
Total Protein: 6.2 g/dL — ABNORMAL LOW (ref 6.4–8.3)

## 2017-04-11 LAB — IRON AND TIBC
%SAT: 25 % (ref 20–55)
Iron: 58 ug/dL (ref 42–163)
TIBC: 230 ug/dL (ref 202–409)
UIBC: 172 ug/dL (ref 117–376)

## 2017-04-11 LAB — FERRITIN: FERRITIN: 838 ng/mL — AB (ref 22–316)

## 2017-04-11 LAB — MAGNESIUM: MAGNESIUM: 2.3 mg/dL (ref 1.5–2.5)

## 2017-04-11 MED ORDER — SODIUM CHLORIDE 0.9% FLUSH
10.0000 mL | INTRAVENOUS | Status: DC | PRN
  Administered 2017-04-11: 10 mL via INTRAVENOUS
  Filled 2017-04-11: qty 10

## 2017-04-11 MED ORDER — SODIUM CHLORIDE 0.9% FLUSH
10.0000 mL | INTRAVENOUS | Status: DC | PRN
Start: 2017-04-11 — End: 2017-04-11
  Administered 2017-04-11: 10 mL
  Filled 2017-04-11: qty 10

## 2017-04-11 MED ORDER — PALONOSETRON HCL INJECTION 0.25 MG/5ML
0.2500 mg | Freq: Once | INTRAVENOUS | Status: AC
  Administered 2017-04-11: 0.25 mg via INTRAVENOUS

## 2017-04-11 MED ORDER — PALONOSETRON HCL INJECTION 0.25 MG/5ML
INTRAVENOUS | Status: AC
  Filled 2017-04-11: qty 5

## 2017-04-11 MED ORDER — SODIUM CHLORIDE 0.9 % IV SOLN
258.8000 mg | Freq: Once | INTRAVENOUS | Status: AC
  Administered 2017-04-11: 260 mg via INTRAVENOUS
  Filled 2017-04-11: qty 26

## 2017-04-11 MED ORDER — HEPARIN SOD (PORK) LOCK FLUSH 100 UNIT/ML IV SOLN
500.0000 [IU] | Freq: Once | INTRAVENOUS | Status: AC | PRN
  Administered 2017-04-11: 500 [IU]
  Filled 2017-04-11: qty 5

## 2017-04-11 MED ORDER — SODIUM CHLORIDE 0.9 % IV SOLN
Freq: Once | INTRAVENOUS | Status: AC
  Administered 2017-04-11: 10:00:00 via INTRAVENOUS

## 2017-04-11 MED ORDER — PACLITAXEL PROTEIN-BOUND CHEMO INJECTION 100 MG
50.0000 mg/m2 | Freq: Once | INTRAVENOUS | Status: AC
  Administered 2017-04-11: 100 mg via INTRAVENOUS
  Filled 2017-04-11: qty 20

## 2017-04-11 MED ORDER — DEXAMETHASONE SODIUM PHOSPHATE 10 MG/ML IJ SOLN
10.0000 mg | Freq: Once | INTRAMUSCULAR | Status: AC
  Administered 2017-04-11: 10 mg via INTRAVENOUS

## 2017-04-11 MED ORDER — HYDROCODONE-ACETAMINOPHEN 7.5-325 MG/15ML PO SOLN: 15.0000 mL | Freq: Four times a day (QID) | ORAL | 0 refills | Status: AC | PRN

## 2017-04-11 MED ORDER — SODIUM CHLORIDE 0.9 % IV SOLN
Freq: Once | INTRAVENOUS | Status: AC
  Administered 2017-04-11: 12:00:00 via INTRAVENOUS

## 2017-04-11 MED ORDER — DEXAMETHASONE SODIUM PHOSPHATE 10 MG/ML IJ SOLN
INTRAMUSCULAR | Status: AC
  Filled 2017-04-11: qty 1

## 2017-04-11 NOTE — Progress Notes (Signed)
Checked patient's vitals  In Radiation dept , patient siting in dressing room prior to radiation treatment, stated "they gave me fluids upstairs "I feel  Little better", will have patient see Dr. Lisbeth Renshaw tomorrow after treatment as his cancelled treatment on Friday , MD out of office today 2:39 PM BP 110/69 Comment: LFA sitting  Pulse 92   Temp 98.4 F (36.9 C) (Oral)   Resp 20   SpO2 100% Comment: room air

## 2017-04-11 NOTE — Patient Instructions (Signed)
[facility] Discharge Instructions for Patients Receiving Chemotherapy  Today you received the following chemotherapy agents Abraxane and Carboplatin  To help prevent nausea and vomiting after your treatment, we encourage you to take your nausea medication as directed If you develop nausea and vomiting that is not controlled by your nausea medication, call the clinic.   BELOW ARE SYMPTOMS THAT SHOULD BE REPORTED IMMEDIATELY:  *FEVER GREATER THAN 100.5 F  *CHILLS WITH OR WITHOUT FEVER  NAUSEA AND VOMITING THAT IS NOT CONTROLLED WITH YOUR NAUSEA MEDICATION  *UNUSUAL SHORTNESS OF BREATH  *UNUSUAL BRUISING OR BLEEDING  TENDERNESS IN MOUTH AND THROAT WITH OR WITHOUT PRESENCE OF ULCERS  *URINARY PROBLEMS  *BOWEL PROBLEMS  UNUSUAL RASH Items with * indicate a potential emergency and should be followed up as soon as possible.  Feel free to call the clinic you have any questions or concerns. The clinic phone number is [phone].  Please show the [facility] at check-in to the Emergency Department and triage nurse.     Dehydration, Adult Dehydration is when there is not enough fluid or water in your body. This happens when you lose more fluids than you take in. Dehydration can range from mild to very bad. It should be treated right away to keep it from getting very bad. Symptoms of mild dehydration may include:  Thirst.  Dry lips.  Slightly dry mouth.  Dry, warm skin.  Dizziness. Symptoms of moderate dehydration may include:  Very dry mouth.  Muscle cramps.  Dark pee (urine). Pee may be the color of tea.  Your body making less pee.  Your eyes making fewer tears.  Heartbeat that is uneven or faster than normal (palpitations).  Headache.  Light-headedness, especially when you stand up from sitting.  Fainting (syncope). Symptoms of very bad dehydration may include:  Changes in skin, such as: ? Cold and clammy skin. ? Blotchy (mottled)  or pale skin. ? Skin that does not quickly return to normal after being lightly pinched and let go (poor skin turgor).  Changes in body fluids, such as: ? Feeling very thirsty. ? Your eyes making fewer tears. ? Not sweating when body temperature is high, such as in hot weather. ? Your body making very little pee.  Changes in vital signs, such as: ? Weak pulse. ? Pulse that is more than 100 beats a minute when you are sitting still. ? Fast breathing. ? Low blood pressure.  Other changes, such as: ? Sunken eyes. ? Cold hands and feet. ? Confusion. ? Lack of energy (lethargy). ? Trouble waking up from sleep. ? Short-term weight loss. ? Unconsciousness. Follow these instructions at home:  If told by your doctor, drink an ORS: ? Make an ORS by using instructions on the package. ? Start by drinking small amounts, about  cup (120 mL) every 5-10 minutes. ? Slowly drink more until you have had the amount that your doctor said to have.  Drink enough clear fluid to keep your pee clear or pale yellow. If you were told to drink an ORS, finish the ORS first, then start slowly drinking clear fluids. Drink fluids such as: ? Water. Do not drink only water by itself. Doing that can make the salt (sodium) level in your body get too low (hyponatremia). ? Ice chips. ? Fruit juice that you have added water to (diluted). ? Low-calorie sports drinks.  Avoid: ? Alcohol. ? Drinks that have a lot of sugar. These include high-calorie sports drinks, fruit  juice that does not have water added, and soda. ? Caffeine. ? Foods that are greasy or have a lot of fat or sugar.  Take over-the-counter and prescription medicines only as told by your doctor.  Do not take salt tablets. Doing that can make the salt level in your body get too high (hypernatremia).  Eat foods that have minerals (electrolytes). Examples include bananas, oranges, potatoes, tomatoes, and spinach.  Keep all follow-up visits as told  by your doctor. This is important. Contact a doctor if:  You have belly (abdominal) pain that: ? Gets worse. ? Stays in one area (localizes).  You have a rash.  You have a stiff neck.  You get angry or annoyed more easily than normal (irritability).  You are more sleepy than normal.  You have a harder time waking up than normal.  You feel: ? Weak. ? Dizzy. ? Very thirsty.  You have peed (urinated) only a small amount of very dark pee during 6-8 hours. Get help right away if:  You have symptoms of very bad dehydration.  You cannot drink fluids without throwing up (vomiting).  Your symptoms get worse with treatment.  You have a fever.  You have a very bad headache.  You are throwing up or having watery poop (diarrhea) and it: ? Gets worse. ? Does not go away.  You have blood or something green (bile) in your throw-up.  You have blood in your poop (stool). This may cause poop to look black and tarry.  You have not peed in 6-8 hours.  You pass out (faint).  Your heart rate when you are sitting still is more than 100 beats a minute.  You have trouble breathing. This information is not intended to replace advice given to you by your health care provider. Make sure you discuss any questions you have with your health care provider. Document Released: 05/08/2009 Document Revised: 01/30/2016 Document Reviewed: 09/05/2015 Elsevier Interactive Patient Education  2018 [employer].

## 2017-04-12 ENCOUNTER — Encounter: Payer: Self-pay | Admitting: Hematology

## 2017-04-12 ENCOUNTER — Ambulatory Visit
Admission: RE | Admit: 2017-04-12 | Discharge: 2017-04-12 | Disposition: A | Discharge status: Home / Self Care | Payer: Medicare Other | Source: Ambulatory Visit | Attending: Radiation Oncology | Admitting: Radiation Oncology

## 2017-04-12 ENCOUNTER — Other Ambulatory Visit (HOSPITAL_COMMUNITY): Payer: Self-pay | Admitting: Hematology

## 2017-04-12 ENCOUNTER — Telehealth: Payer: Self-pay | Admitting: Hematology

## 2017-04-12 LAB — PHOSPHORUS: PHOSPHORUS: 1.5 mg/dL — AB (ref 2.5–4.5)

## 2017-04-12 NOTE — Telephone Encounter (Signed)
Scheduled appt per 9/17 los - left message with appt date and time.

## 2017-04-13 ENCOUNTER — Ambulatory Visit
Admission: RE | Admit: 2017-04-13 | Discharge: 2017-04-13 | Disposition: A | Discharge status: Home / Self Care | Payer: Medicare Other | Source: Ambulatory Visit | Attending: Radiation Oncology | Admitting: Radiation Oncology

## 2017-04-13 NOTE — Progress Notes (Signed)
Patient orthostatic vitals taken, patient only has 2 cans osmolite in via peg today, does flush with free water stated, still light headed, thinks heat from today,  BP 110/67 Right arm, sitting, Pulse=118,RR=20,Room air sats=100%, Temp=98.7 BP (!) 87/53 (BP Location: Right Arm, Patient Position: Standing)   Pulse (!) 126   Temp 98.7 F (37.1 C) (Oral)   Resp 20   Wt 155 lb 3.2 oz (70.4 kg)   SpO2 100% Comment: room air  BMI 18.89 kg/m   patient has lost 2 lbs since yesterday\2:51 PM

## 2017-04-14 ENCOUNTER — Ambulatory Visit
Admission: RE | Admit: 2017-04-14 | Discharge: 2017-04-14 | Disposition: A | Discharge status: Home / Self Care | Payer: Medicare Other | Source: Ambulatory Visit | Attending: Radiation Oncology | Admitting: Radiation Oncology

## 2017-04-14 ENCOUNTER — Ambulatory Visit (HOSPITAL_BASED_OUTPATIENT_CLINIC_OR_DEPARTMENT_OTHER): Payer: No Typology Code available for payment source

## 2017-04-14 ENCOUNTER — Telehealth: Payer: Self-pay

## 2017-04-14 VITALS — BP 117/69 | HR 97 | Temp 98.7°F | Resp 18

## 2017-04-14 DIAGNOSIS — C155 Malignant neoplasm of lower third of esophagus: Secondary | ICD-10-CM | POA: Diagnosis not present

## 2017-04-14 DIAGNOSIS — D509 Iron deficiency anemia, unspecified: Secondary | ICD-10-CM

## 2017-04-14 DIAGNOSIS — R195 Other fecal abnormalities: Secondary | ICD-10-CM

## 2017-04-14 MED ORDER — HEPARIN SOD (PORK) LOCK FLUSH 100 UNIT/ML IV SOLN
500.0000 [IU] | Freq: Once | INTRAVENOUS | Status: AC | PRN
  Administered 2017-04-14: 500 [IU]
  Filled 2017-04-14: qty 5

## 2017-04-14 MED ORDER — SODIUM CHLORIDE 0.9% FLUSH
10.0000 mL | INTRAVENOUS | Status: DC | PRN
  Administered 2017-04-14: 10 mL
  Filled 2017-04-14: qty 10

## 2017-04-14 MED ORDER — SODIUM CHLORIDE 0.9 % IV SOLN
Freq: Once | INTRAVENOUS | Status: AC
  Administered 2017-04-14: 15:00:00 via INTRAVENOUS

## 2017-04-14 NOTE — Patient Instructions (Signed)
Dehydration, Adult Dehydration is when there is not enough fluid or water in your body. This happens when you lose more fluids than you take in. Dehydration can range from mild to very bad. It should be treated right away to keep it from getting very bad. Symptoms of mild dehydration may include:  Thirst.  Dry lips.  Slightly dry mouth.  Dry, warm skin.  Dizziness. Symptoms of moderate dehydration may include:  Very dry mouth.  Muscle cramps.  Dark pee (urine). Pee may be the color of tea.  Your body making less pee.  Your eyes making fewer tears.  Heartbeat that is uneven or faster than normal (palpitations).  Headache.  Light-headedness, especially when you stand up from sitting.  Fainting (syncope). Symptoms of very bad dehydration may include:  Changes in skin, such as: ? Cold and clammy skin. ? Blotchy (mottled) or pale skin. ? Skin that does not quickly return to normal after being lightly pinched and let go (poor skin turgor).  Changes in body fluids, such as: ? Feeling very thirsty. ? Your eyes making fewer tears. ? Not sweating when body temperature is high, such as in hot weather. ? Your body making very little pee.  Changes in vital signs, such as: ? Weak pulse. ? Pulse that is more than 100 beats a minute when you are sitting still. ? Fast breathing. ? Low blood pressure.  Other changes, such as: ? Sunken eyes. ? Cold hands and feet. ? Confusion. ? Lack of energy (lethargy). ? Trouble waking up from sleep. ? Short-term weight loss. ? Unconsciousness. Follow these instructions at home:  If told by your doctor, drink an ORS: ? Make an ORS by using instructions on the package. ? Start by drinking small amounts, about  cup (120 mL) every 5-10 minutes. ? Slowly drink more until you have had the amount that your doctor said to have.  Drink enough clear fluid to keep your pee clear or pale yellow. If you were told to drink an ORS, finish the ORS  first, then start slowly drinking clear fluids. Drink fluids such as: ? Water. Do not drink only water by itself. Doing that can make the salt (sodium) level in your body get too low (hyponatremia). ? Ice chips. ? Fruit juice that you have added water to (diluted). ? Low-calorie sports drinks.  Avoid: ? Alcohol. ? Drinks that have a lot of sugar. These include high-calorie sports drinks, fruit juice that does not have water added, and soda. ? Caffeine. ? Foods that are greasy or have a lot of fat or sugar.  Take over-the-counter and prescription medicines only as told by your doctor.  Do not take salt tablets. Doing that can make the salt level in your body get too high (hypernatremia).  Eat foods that have minerals (electrolytes). Examples include bananas, oranges, potatoes, tomatoes, and spinach.  Keep all follow-up visits as told by your doctor. This is important. Contact a doctor if:  You have belly (abdominal) pain that: ? Gets worse. ? Stays in one area (localizes).  You have a rash.  You have a stiff neck.  You get angry or annoyed more easily than normal (irritability).  You are more sleepy than normal.  You have a harder time waking up than normal.  You feel: ? Weak. ? Dizzy. ? Very thirsty.  You have peed (urinated) only a small amount of very dark pee during 6-8 hours. Get help right away if:  You have symptoms of   very bad dehydration.  You cannot drink fluids without throwing up (vomiting).  Your symptoms get worse with treatment.  You have a fever.  You have a very bad headache.  You are throwing up or having watery poop (diarrhea) and it: ? Gets worse. ? Does not go away.  You have blood or something green (bile) in your throw-up.  You have blood in your poop (stool). This may cause poop to look black and tarry.  You have not peed in 6-8 hours.  You pass out (faint).  Your heart rate when you are sitting still is more than 100 beats a  minute.  You have trouble breathing. This information is not intended to replace advice given to you by your health care provider. Make sure you discuss any questions you have with your health care provider. Document Released: 05/08/2009 Document Revised: 01/30/2016 Document Reviewed: 09/05/2015 Elsevier Interactive Patient Education  2018 [employer]  

## 2017-04-14 NOTE — Progress Notes (Signed)
Pt c/o having a "sore throat" over the past couple of days. Pt had been experiencing "hot flashes" also. Pt is not running fever today, norm temp 98.7. Pt does not have thermometer, educated on importance of buying one and Pt educated to take temperature daily. Pt verbalized understanding.Notified Dr Burr Medico, she will stop in tx area to assess pt. Ok to proceed with IVF today.   Per Dr Burr Medico no new orders.

## 2017-04-14 NOTE — Telephone Encounter (Signed)
Spoke with patient concerning additional appointment for today 2.5 inf. Per 9/19 message alert/

## 2017-04-15 ENCOUNTER — Encounter (HOSPITAL_COMMUNITY): Payer: No Typology Code available for payment source

## 2017-04-15 ENCOUNTER — Ambulatory Visit: Payer: No Typology Code available for payment source

## 2017-04-15 ENCOUNTER — Ambulatory Visit
Admission: RE | Admit: 2017-04-15 | Discharge: 2017-04-15 | Disposition: A | Discharge status: Home / Self Care | Payer: Medicare Other | Source: Ambulatory Visit | Attending: Radiation Oncology | Admitting: Radiation Oncology

## 2017-04-15 NOTE — Progress Notes (Signed)
Vitals taken orthostatic  Sitting B/P=117/71, P=104, RR=20, T=98.5, weight =156.4 97% room air  BP 97/71 (BP Location: Right Arm, Patient Position: Standing)   Pulse (!) 124   Temp 98.5 F (36.9 C) (Oral)   Resp 20   Wt 156 lb 6.4 oz (70.9 kg)   SpO2 97%   BMI 19.04 kg/m    pt c/o feeling like"S___T" just wants to go home and rest, has only got in 2 cans osmolite via peg, MD Lisbeth Renshaw saw patient in the bacl, patient request Dr. Burr Medico order more osmolite, se doesn't have many left  3:27 PM

## 2017-04-18 ENCOUNTER — Ambulatory Visit
Admission: RE | Admit: 2017-04-18 | Discharge: 2017-04-18 | Disposition: A | Discharge status: Home / Self Care | Payer: Medicare Other | Source: Ambulatory Visit | Attending: Radiation Oncology | Admitting: Radiation Oncology

## 2017-04-18 ENCOUNTER — Ambulatory Visit: Payer: No Typology Code available for payment source

## 2017-04-18 ENCOUNTER — Ambulatory Visit: Payer: No Typology Code available for payment source | Admitting: Hematology

## 2017-04-18 ENCOUNTER — Other Ambulatory Visit: Payer: No Typology Code available for payment source

## 2017-04-18 ENCOUNTER — Telehealth: Payer: Self-pay | Admitting: *Deleted

## 2017-04-18 ENCOUNTER — Telehealth: Payer: Self-pay | Admitting: Hematology

## 2017-04-18 NOTE — Progress Notes (Signed)
Today's ortho vitals taken after rad txs T=98.3, B/P=133/73,P=115,RR=20,Room air sats=98%  Sitting Standing B/P=106/63,P=118,RR=20 Patient stated he feels a little better today,only able to get 6 cans osmolite via peg in, says reflux started, and he thought his chemotherapy infusion appt was tomorrow, -no today, was missed, escorted patient to scheduling to reschedule this 3:08 PM

## 2017-04-18 NOTE — Progress Notes (Signed)
Rad tx completed, ortho vitals taken,  T=98.3, B/P=133/73 Sitting, P=115,RR=20,Room air sats=98% B/P =106/63 Standing, P=118, RR=20,  Patient stated he is ony getting 6 cans osmolite via peg, gets heartburn,reflux stated, He also stated he thought his chemo infusion was scheduled for today,says he feels a little better today Escorted patient to scheduling in med/onc to reschedule  3:04 PM

## 2017-04-18 NOTE — Telephone Encounter (Signed)
Scheduled appt per sch message 9/24 . Patient is aware. Gave patient calender.

## 2017-04-18 NOTE — Telephone Encounter (Signed)
Pt missed appt today, stated that he thought it was tomorrow.  He will call in [phone] to  Set up a new appt.

## 2017-04-19 ENCOUNTER — Ambulatory Visit
Admission: RE | Admit: 2017-04-19 | Discharge: 2017-04-19 | Disposition: A | Discharge status: Home / Self Care | Payer: Medicare Other | Source: Ambulatory Visit | Attending: Radiation Oncology | Admitting: Radiation Oncology

## 2017-04-19 ENCOUNTER — Ambulatory Visit (HOSPITAL_BASED_OUTPATIENT_CLINIC_OR_DEPARTMENT_OTHER): Payer: Self-pay | Admitting: Nurse Practitioner

## 2017-04-19 ENCOUNTER — Ambulatory Visit: Payer: Self-pay

## 2017-04-19 ENCOUNTER — Encounter: Payer: Self-pay | Admitting: Nurse Practitioner

## 2017-04-19 ENCOUNTER — Other Ambulatory Visit (HOSPITAL_BASED_OUTPATIENT_CLINIC_OR_DEPARTMENT_OTHER): Payer: Self-pay

## 2017-04-19 ENCOUNTER — Ambulatory Visit: Payer: Self-pay | Admitting: Nutrition

## 2017-04-19 ENCOUNTER — Ambulatory Visit (HOSPITAL_BASED_OUTPATIENT_CLINIC_OR_DEPARTMENT_OTHER): Payer: Self-pay

## 2017-04-19 VITALS — BP 127/65 | HR 115

## 2017-04-19 VITALS — BP 93/54 | HR 110 | Temp 98.6°F | Resp 18 | Ht 76.0 in | Wt 155.3 lb

## 2017-04-19 DIAGNOSIS — C155 Malignant neoplasm of lower third of esophagus: Secondary | ICD-10-CM

## 2017-04-19 DIAGNOSIS — R195 Other fecal abnormalities: Secondary | ICD-10-CM

## 2017-04-19 DIAGNOSIS — D509 Iron deficiency anemia, unspecified: Secondary | ICD-10-CM

## 2017-04-19 DIAGNOSIS — C154 Malignant neoplasm of middle third of esophagus: Secondary | ICD-10-CM

## 2017-04-19 LAB — CBC WITH DIFFERENTIAL/PLATELET
BASO%: 0.2 % (ref 0.0–2.0)
BASOS ABS: 0 10*3/uL (ref 0.0–0.1)
EOS%: 0.2 % (ref 0.0–7.0)
Eosinophils Absolute: 0 10*3/uL (ref 0.0–0.5)
HEMATOCRIT: 26.2 % — AB (ref 38.4–49.9)
HGB: 8.9 g/dL — ABNORMAL LOW (ref 13.0–17.1)
LYMPH#: 0.1 10*3/uL — AB (ref 0.9–3.3)
LYMPH%: 2.1 % — AB (ref 14.0–49.0)
MCH: 29.4 pg (ref 27.2–33.4)
MCHC: 34 g/dL (ref 32.0–36.0)
MCV: 86.3 fL (ref 79.3–98.0)
MONO#: 0.5 10*3/uL (ref 0.1–0.9)
MONO%: 19.8 % — ABNORMAL HIGH (ref 0.0–14.0)
NEUT#: 2 10*3/uL (ref 1.5–6.5)
NEUT%: 77.7 % — AB (ref 39.0–75.0)
PLATELETS: 165 10*3/uL (ref 140–400)
RBC: 3.04 10*6/uL — AB (ref 4.20–5.82)
RDW: 19.1 % — ABNORMAL HIGH (ref 11.0–14.6)
WBC: 2.6 10*3/uL — ABNORMAL LOW (ref 4.0–10.3)

## 2017-04-19 LAB — COMPREHENSIVE METABOLIC PANEL
ALT: 10 U/L (ref 0–55)
AST: 11 U/L (ref 5–34)
Albumin: 2.5 g/dL — ABNORMAL LOW (ref 3.5–5.0)
Alkaline Phosphatase: 70 U/L (ref 40–150)
Anion Gap: 5 mEq/L (ref 3–11)
BILIRUBIN TOTAL: 0.25 mg/dL (ref 0.20–1.20)
BUN: 13.6 mg/dL (ref 7.0–26.0)
CALCIUM: 8.8 mg/dL (ref 8.4–10.4)
CHLORIDE: 98 meq/L (ref 98–109)
CO2: 29 mEq/L (ref 22–29)
CREATININE: 0.7 mg/dL (ref 0.7–1.3)
EGFR: >90 mL/min/{1.73_m2} (ref >90)
Glucose: 162 mg/dl — ABNORMAL HIGH (ref 70–140)
Potassium: 3.8 mEq/L (ref 3.5–5.1)
Sodium: 131 mEq/L — ABNORMAL LOW (ref 136–145)
TOTAL PROTEIN: 6.2 g/dL — AB (ref 6.4–8.3)

## 2017-04-19 MED ORDER — HEPARIN SOD (PORK) LOCK FLUSH 100 UNIT/ML IV SOLN
500.0000 [IU] | Freq: Once | INTRAVENOUS | Status: AC | PRN
  Administered 2017-04-19: 500 [IU]
  Filled 2017-04-19: qty 5

## 2017-04-19 MED ORDER — SODIUM CHLORIDE 0.9% FLUSH
10.0000 mL | INTRAVENOUS | Status: DC | PRN
  Administered 2017-04-19: 10 mL
  Filled 2017-04-19: qty 10

## 2017-04-19 MED ORDER — FAMOTIDINE IN NACL 20-0.9 MG/50ML-% IV SOLN
20.0000 mg | Freq: Once | INTRAVENOUS | Status: AC
  Administered 2017-04-19: 20 mg via INTRAVENOUS

## 2017-04-19 MED ORDER — SODIUM CHLORIDE 0.9% FLUSH
10.0000 mL | INTRAVENOUS | Status: DC | PRN
  Administered 2017-04-19: 10 mL via INTRAVENOUS
  Filled 2017-04-19: qty 10

## 2017-04-19 MED ORDER — FAMOTIDINE IN NACL 20-0.9 MG/50ML-% IV SOLN
INTRAVENOUS | Status: AC
  Filled 2017-04-19: qty 50

## 2017-04-19 MED ORDER — SODIUM CHLORIDE 0.9 % IV SOLN
Freq: Once | INTRAVENOUS | Status: AC
  Administered 2017-04-19: 15:00:00 via INTRAVENOUS

## 2017-04-19 NOTE — Progress Notes (Signed)
Nutrition follow-up with patient during infusion for esophageal cancer. Weight has improved and was documented as 156 pounds September 24, increased from 152.4 pounds September 5. Spoke with patient's wife because patient had his eyes closed and was sleepy. Wife reports that he gets 6 bottles of Osmolite 1.5 most days of the week. He gives 2 bottles, 3 times a day and tolerates well. He is not eating or drinking much secondary to taste alterations.  Estimated estimated nutrition needs: 2070-2415 calories, 103-138 grams protein, 2.4 L fluid.  Nutrition diagnosis: Inadequate oral intake continues and severe malnutrition continues.  Intervention: Educated patient to continue Osmolite 1.5, 2 cans 3 times a day with 60 mL free water before and after bolus feedings. Encouraged patient to drink loss of water throughout the day and eat soft foods as tolerated. Educated patient on strategies for improving taste alterations.  6 bottles of Osmolite 1.5 provides 2330 cal, 109 g protein, and 1086 mL free water (formula only)  Monitoring, evaluation, goals: Patient will tolerate Osmolite 1.5 to goal rate to minimize weight loss. He will continue to try to eat and drink as tolerated.  Next visit: Monday, October 1 during infusion.  **Disclaimer: This note was dictated with voice recognition software. Similar sounding words can inadvertently be transcribed and this note may contain transcription errors which may not have been corrected upon publication of note.**

## 2017-04-19 NOTE — Patient Instructions (Signed)
Dehydration, Adult Dehydration is when there is not enough fluid or water in your body. This happens when you lose more fluids than you take in. Dehydration can range from mild to very bad. It should be treated right away to keep it from getting very bad. Symptoms of mild dehydration may include:  Thirst.  Dry lips.  Slightly dry mouth.  Dry, warm skin.  Dizziness. Symptoms of moderate dehydration may include:  Very dry mouth.  Muscle cramps.  Dark pee (urine). Pee may be the color of tea.  Your body making less pee.  Your eyes making fewer tears.  Heartbeat that is uneven or faster than normal (palpitations).  Headache.  Light-headedness, especially when you stand up from sitting.  Fainting (syncope). Symptoms of very bad dehydration may include:  Changes in skin, such as: ? Cold and clammy skin. ? Blotchy (mottled) or pale skin. ? Skin that does not quickly return to normal after being lightly pinched and let go (poor skin turgor).  Changes in body fluids, such as: ? Feeling very thirsty. ? Your eyes making fewer tears. ? Not sweating when body temperature is high, such as in hot weather. ? Your body making very little pee.  Changes in vital signs, such as: ? Weak pulse. ? Pulse that is more than 100 beats a minute when you are sitting still. ? Fast breathing. ? Low blood pressure.  Other changes, such as: ? Sunken eyes. ? Cold hands and feet. ? Confusion. ? Lack of energy (lethargy). ? Trouble waking up from sleep. ? Short-term weight loss. ? Unconsciousness. Follow these instructions at home:  If told by your doctor, drink an ORS: ? Make an ORS by using instructions on the package. ? Start by drinking small amounts, about  cup (120 mL) every 5-10 minutes. ? Slowly drink more until you have had the amount that your doctor said to have.  Drink enough clear fluid to keep your pee clear or pale yellow. If you were told to drink an ORS, finish the ORS  first, then start slowly drinking clear fluids. Drink fluids such as: ? Water. Do not drink only water by itself. Doing that can make the salt (sodium) level in your body get too low (hyponatremia). ? Ice chips. ? Fruit juice that you have added water to (diluted). ? Low-calorie sports drinks.  Avoid: ? Alcohol. ? Drinks that have a lot of sugar. These include high-calorie sports drinks, fruit juice that does not have water added, and soda. ? Caffeine. ? Foods that are greasy or have a lot of fat or sugar.  Take over-the-counter and prescription medicines only as told by your doctor.  Do not take salt tablets. Doing that can make the salt level in your body get too high (hypernatremia).  Eat foods that have minerals (electrolytes). Examples include bananas, oranges, potatoes, tomatoes, and spinach.  Keep all follow-up visits as told by your doctor. This is important. Contact a doctor if:  You have belly (abdominal) pain that: ? Gets worse. ? Stays in one area (localizes).  You have a rash.  You have a stiff neck.  You get angry or annoyed more easily than normal (irritability).  You are more sleepy than normal.  You have a harder time waking up than normal.  You feel: ? Weak. ? Dizzy. ? Very thirsty.  You have peed (urinated) only a small amount of very dark pee during 6-8 hours. Get help right away if:  You have symptoms of   very bad dehydration.  You cannot drink fluids without throwing up (vomiting).  Your symptoms get worse with treatment.  You have a fever.  You have a very bad headache.  You are throwing up or having watery poop (diarrhea) and it: ? Gets worse. ? Does not go away.  You have blood or something green (bile) in your throw-up.  You have blood in your poop (stool). This may cause poop to look black and tarry.  You have not peed in 6-8 hours.  You pass out (faint).  Your heart rate when you are sitting still is more than 100 beats a  minute.  You have trouble breathing. This information is not intended to replace advice given to you by your health care provider. Make sure you discuss any questions you have with your health care provider. Document Released: 05/08/2009 Document Revised: 01/30/2016 Document Reviewed: 09/05/2015 Elsevier Interactive Patient Education  2018 [employer]  

## 2017-04-19 NOTE — Progress Notes (Addendum)
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     Malignant neoplasm of lower third of esophagus ([place])   01/29/2017 Imaging    CT abd/pelvis: 1. No acute abnormality or change from prior exam. 2. Heterogeneous nodular prostate gland with hyperdense nodularity extending into the bladder base. 3. Moderate hiatal hernia with distal esophageal wall thickening. Wall thickening likely secondary to esophagitis/reflux, cannot exclude esophageal malignancy in the setting of decreased appetite and weight loss. Consider endoscopy.      02/09/2017 Pathology Results    EGD/Colonoscopy by Dr. Laural Golden- One moderate (circumferential scarring or stenosis; an endoscope may pass) malignant-appearing, intrinsic stenosis was found 37 to 41 cm from the incisors. This measured 8 mm (inner diameter) x 4 cm (in length) and was traversed.at the distal and it was polypoidal or masslike. Biopsies were taken with a cold forceps for histology.      02/11/2017 Pathology Results    INVASIVE POORLY DIFFERENTIATED ADENOCARCINOMA.      02/15/2017 Imaging    CT Chest W Contrast 02/15/17 IMPRESSION: 1. There is a large mass involving the mid and distal esophagus. The mass invades the middle mediastinum and encases the left mainstem bronchus. 2. Evidence of mediastinal and bilateral hilar lymph node metastasis. Enlarged gastrohepatic ligament node is also noted. 3. Hiatal hernia.      02/16/2017 Cancer Staging    Cancer Staging Esophageal  [facility]) Staging form: Esophagus - Adenocarcinoma, AJCC 8th Edition - Clinical stage from 02/16/2017: Stage IVA (cT4b, cN2, cM0) - Signed by Baird Cancer, PA-C on 02/16/2017       03/07/2017 Procedure    Placement of G-tube & port-a-cath by IR.       03/22/2017 -  Chemotherapy    Weekly Carbo Taxol with concurrent radiation       03/22/2017 -  Radiation Therapy    Concurrent chemoradiation with Dr. Lisbeth Renshaw       INTERIM HISTORY:  Kurt Fisher is here for Esophogeal cancer. He presented to the clinic for consult with his his ex-wife.   He started noticing trouble swallowing in 08/2016. He then started to throw up regularly and not be able to eat certain things at the end of 11/2016. He say GI doctor in [place] and gave Protonix. He also had a hiatal hernia. He had an EGD in 02/09/17 by dr. Laural Golden.   Before feeding tube placement he lost about 50 pounds. He gained weight since feeding tube placement. He is doing 2.5 cans a day at the moment and will increase to 6 cans a day. He is starting to feel better overall. He is able to tolerate liquids such as jelly, water, and soup. He lives by himself. His energy improved. His pain form the tube placement is still there when he moves certain ways.   He has 2 children 25 and 25 years old. He is on disability due to his mental health. He has a psychiatrist. He is taking Paxil and Seroquel.   His father committed suicide in 2015 and he had a mental reaction to that where he  went to the hospital. He feels he is able to handle chemo diagnosis right now. His father had cancer and his mother had lung cancer at 61. His paternal uncle had brain cancer, his paternal grandmother had lung cancer. He smokes marijuana  as needed. He takes hydrocodone PRN for G tube pain.   CURRENT THERAPY: Concurrent chemoradiation with weekly carbo taxol started 03/22/17, taxol changed to Abraxane due to significant reaction to Taxol, starting 04/05/17.  INTERVAL  HISTORY:  Kurt Fisher returns for follow up as scheduled. He is due for his 4th dose of weekly abraxane/carbo with concurrent radiation. His last chemo was on 04/11/17 and he required IVF on 04/14/17. Today he feels poorly and has not recovered since last treatment. Moderately fatigued and weak, only leaves his home for treatments. This affects his feeding schedule as he spends hours away from home. Per G-tube he tolerates 2 cans osmolite 1.5 per feeding 3 times per day with 120 cc free water with each feeding. He was told to increase to 8 cans per day at last visit but this causes reflux and belching, otherwise no dysphagia. He reports drinking approx 6 water bottles per day. He has intermittent nausea controlled with PRN zofran and compazine, has no appetite and does not take food po. He has alternating chills and sweats that last 5 minutes or less, occurring sometimes up to twice per day, other days does not occur. He denies fever. Mild Gtube site pain, controlled with Hycet PRN.   REVIEW OF SYSTEMS:   Constitutional: Denies fevers or weight loss. (+) alternating chills and sweats, infrequent  Eyes: Denies blurriness of vision Ears, nose, mouth, throat, and face: Denies mucositis, sore throat, or dysphagia Respiratory: Denies cough, dyspnea or wheezes Cardiovascular: Denies palpitation, chest discomfort or lower extremity swelling Gastrointestinal:  Denies constipation, diarrhea, vomiting, or change in bowel habits (+) nausea (+) belching (+) reflux Skin: Denies abnormal skin rashes or breakdown Lymphatics: Denies new lymphadenopathy or easy bruising Neurological:Denies numbness, tingling (+) mild weakness Behavioral/Psych: Mood is stable, no new changes  All other systems were reviewed with the patient and are negative.  MEDICAL HISTORY:  Past Medical History:  Diagnosis Date  . Anxiety   . Bipolar 1 disorder ([place])   . Depression   . Esophageal cancer ([place]) 02/16/2017  . GERD (gastroesophageal  reflux disease)    SURGICAL HISTORY: Past Surgical History:  Procedure Laterality Date  . BIOPSY  02/09/2017   Procedure: BIOPSY;  Surgeon: Rogene Houston, MD;  [facility];  Service: Endoscopy;;  gastric/GE Junction/distal esophagus bx   . COLONOSCOPY N/A 02/09/2017   Procedure: COLONOSCOPY;  Surgeon: Rogene Houston, MD;  [facility];  Service: Endoscopy;  Laterality: N/A;  . ESOPHAGOGASTRODUODENOSCOPY N/A 02/09/2017   Procedure: ESOPHAGOGASTRODUODENOSCOPY (EGD);  Surgeon: Rogene Houston, MD;  [facility];  Service: Endoscopy;  Laterality: N/A;  2:40-moved up to 1255 per Lelon Frohlich  . IR FLUORO GUIDE PORT INSERTION RIGHT  03/07/2017  . IR GASTROSTOMY TUBE MOD SED  03/07/2017  . IR PATIENT EVAL TECH 0-60 MINS  03/18/2017  . IR US GUIDE VASC ACCESS RIGHT  03/07/2017   I have reviewed the social history and family history with the patient and they are unchanged from previous note.  ALLERGIES:  is allergic to taxol [paclitaxel].  MEDICATIONS:  Current Outpatient Prescriptions  Medication Sig Dispense Refill  . dexamethasone (DECADRON) 4 MG tablet Take 1 tablet (4 mg total) by mouth 2 (two) times daily  with a meal. 20 tablet 0  . emollient (BIAFINE) cream Apply 1 application topically 2 (two) times daily. Apply to chest area after rad tx daily and prn,nothing 4 hours prior to rad txs,    . HYDROcodone-acetaminophen (HYCET) 7.5-325 mg/15 ml solution Take 15 mLs by mouth every 6 (six) hours as needed for moderate pain. 473 mL 0  . lidocaine-prilocaine (EMLA) cream Apply 1 application topically as needed. Apply to portacath  1 1/2 hours to 2 hours prior to procedures as needed. 30 g 1  . Nutritional Supplements (FEEDING SUPPLEMENT, OSMOLITE 1.5 CAL,) LIQD Give 1 1/2 cans of tube feeding via feeding tube 4 times per day.  Flush tube with 7ml of water before and after feeding.   Start with 1/2 can of tube feeding 4 times per day and increase by 1/2 can daily until goal  rate of 6 cans per day reached. Send bolus tube feeding supplies. 1422 mL 0  . ondansetron (ZOFRAN) 4 MG tablet TAKE 2 TABLETS BY MOUTH EVERY 8 HOURS AS NEEDED FOR NAUSEA OR VOMITING 30 tablet 2  . pantoprazole (PROTONIX) 40 MG tablet Take 1 tablet (40 mg total) by mouth 2 (two) times daily before a meal. 60 tablet 3  . PARoxetine (PAXIL) 40 MG tablet Take 40 mg by mouth every evening.     . prochlorperazine (COMPAZINE) 10 MG tablet TAKE 1 TABLET BY MOUTH EVERY 6 HOURS AS NEEDED FOR NAUSEA OR VOMITING 30 tablet 2  . QUEtiapine (SEROQUEL) 300 MG tablet Take 600 mg by mouth at bedtime.      No current facility-administered medications for this visit.    Facility-Administered Medications Ordered in Other Visits  Medication Dose Route Frequency Provider Last Rate Last Dose  . sodium chloride flush (NS) 0.9 % injection 10 mL  10 mL Intravenous PRN Truitt Merle, MD   10 mL at 04/05/17 1343   PHYSICAL EXAMINATION: ECOG PERFORMANCE STATUS: 3 - Symptomatic, >50% confined to bed  Vitals:   04/19/17 1255 04/19/17 1330  BP: (!) 104/57 (!) 93/54  Pulse: (!) 117 (!) 110  Resp: 18   Temp: 98.6 F (37 C)   SpO2: 100%    Filed Weights   04/19/17 1255  Weight: 155 lb 4.8 oz (70.4 kg)   GENERAL:alert, no distress and comfortable SKIN: skin color, texture are normal, no rashes or significant lesions. Slight hyperpigmentation to chest radiation field. Decreased skin turgor at forearm.  EYES: normal, Conjunctiva are pink and non-injected, sclera clear OROPHARYNX:no exudate, no erythema and lips, buccal mucosa, and tongue normal  NECK: supple, thyroid normal size, non-tender, without nodularity LYMPH:  no palpable cervical or supraclavicular lymphadenopathy  LUNGS: clear to auscultation bilaterally with normal breathing effort HEART: regular rate & rhythm, S1 and S2 present; no murmurs, no lower extremity edema ABDOMEN:abdomen soft, non-tender and normal bowel sounds. G-tube present, dressing  C/D/I  Musculoskeletal:no cyanosis of digits and no clubbing  NEURO: alert & oriented x 3 with fluent speech, no focal motor/sensory deficits. Flat affect.  LABORATORY DATA:  I have reviewed the data as listed CBC Latest Ref Rng & Units 04/19/2017 04/11/2017 04/05/2017  WBC 4.0 - 10.3 10e3/uL 2.6(L) 5.2 4.1  Hemoglobin 13.0 - 17.1 g/dL 8.9(L) 9.8(L) 10.1(L)  Hematocrit 38.4 - 49.9 % 26.2(L) 29.7(L) 30.9(L)  Platelets 140 - 400 10e3/uL 165 151 203   CMP Latest Ref Rng & Units 04/19/2017 04/11/2017 04/05/2017  Glucose 70 - 140 mg/dl 162(H) 151(H) 179(H)  BUN 7.0 - 26.0 mg/dL 13.6  15.8 21.2  Creatinine 0.7 - 1.3 mg/dL 0.7 0.7 0.7  Sodium 136 - 145 mEq/L 131(L) 135(L) 135(L)  Potassium 3.5 - 5.1 mEq/L 3.8 3.9 3.4(L)  Chloride 101 - 111 mmol/L - - -  CO2 22 - 29 mEq/L 29 28 27   Calcium 8.4 - 10.4 mg/dL 8.8 8.8 9.1  Total Protein 6.4 - 8.3 g/dL 6.2(L) 6.2(L) 6.8  Total Bilirubin 0.20 - 1.20 mg/dL 0.25 <0.22 <0.22  Alkaline Phos 40 - 150 U/L 70 72 74  AST 5 - 34 U/L 11 13 8   ALT 0 - 55 U/L 10 10 7    RADIOGRAPHIC STUDIES: I have personally reviewed the radiological images as listed and agreed with the findings in the report. No results found.   ASSESSMENT & PLAN:  Kurt Fisher is a 56 y.o. Caucasian male with a history of anxiety, GERD, depression, and Bipolar 1 disorder.   1. Adenocarcinoma of the Esophagus, Stage IVA ([id number]) -Primary oncologist has previously reviewed the patients medical records, scans, and pathology with the patient in detail.  -He understands the treatment plan and goal of therapy, which is curative at this point. If he does not have a good response to chemoradiation and is not a surgical candidate, the goal of treatment is palliative. -He began chemoradiation with weekly taxol and carboplatin on 03/22/17.  -Chemo was held on 03/30/17 week 2 due to hypotension, dizziness, and fatigue; he received feraheme and IVF on 04/01/17 -Therapy was changed from taxol to abraxane  on week 3 due to taxol infusion reaction with flushing, nausea, and lightheadedness, he received solumedrol 60 mg IV and flushing subsided but did not receive the full dose of taxol. -Today he presents with decreased performance status and evidence of dehydration such as decreased skin turgor, tachycardia, and hypotension.  -I recommend to hold chemo today and give 1 L NS over 2 hours, he will continue with radiation today.  -He will return in 2-3 days for reevaluation and possible chemotherapy -He is due to return for lab, flush, and chemo on 10/1; will adjust this schedule if he gets treated later this week -his last radiation is planned for 05/05/17   2. Weight loss/Nutrician  -weight is overall stable, will continue to monitor -he appears dehydrated with decreased skin turgor, tachycardia, and hypotension likely related to chemotherapy, radiation, and insufficient intake  -he will increase frequency and amount of G-tube feedings as tolerated to 7 cans daily with additional free water flush, he could not tolerate 8 cans -f/u with dietician  3. Iron deficient anemia -iron 34 on 03/15/17, received feraheme on 03/30/17 -iron increased to 58 on 9/17 -Hgb 8.9 today, he does not require transfusion at this time; will continue to monitor  4. Bipolar -Overall stable, continue medication.  5. Borderline hypotension -likely related to chemotherapy, radiation, and decreased po intake -he is not on blood pressure medication -BP 93/54 today, he will receive 1L NS over 2 hours in the treatment room and will increase his G-tube and po intake as tolerated.   Plan -hold chemo today -1L NS over 2 hours today -increase feedings to 7 cans total per day, he does not tolerate 8, and add additional water flush and po intake -continue PRN anti-emetics and pain medication -continue radiation daily -return in 2-3 days for reassessment and possible chemo abraxane/carboplatin  Patient seen with Dr. Benay Spice.  All questions were answered. The patient knows to call the clinic with any problems, questions or concerns. No barriers to learning was  detected.   Alla Feeling, NP 04/19/17  This was a shared visit with Cira Rue. Mr. Gude was interviewed and examined. We decided to hold chemotherapy today secondary to his poor performance status. He will return on 04/14/2017 with the plan to complete week #4 Taxol/carboplatin.  Julieanne Manson, M.D.

## 2017-04-20 ENCOUNTER — Ambulatory Visit
Admission: RE | Admit: 2017-04-20 | Discharge: 2017-04-20 | Disposition: A | Discharge status: Home / Self Care | Payer: Medicare Other | Source: Ambulatory Visit | Attending: Radiation Oncology | Admitting: Radiation Oncology

## 2017-04-20 NOTE — Progress Notes (Signed)
Patient refused to have orthostatic vitals or weight taken today.  He told the radiation therapists that he had fluids yesterday and is "good" today.

## 2017-04-21 ENCOUNTER — Inpatient Hospital Stay (HOSPITAL_COMMUNITY)
Admission: EM | Admit: 2017-04-21 | Discharge: 2017-04-22 | DRG: 812 | Disposition: A | Discharge status: Home / Self Care | Payer: Medicare Other | Attending: Internal Medicine | Admitting: Internal Medicine

## 2017-04-21 ENCOUNTER — Other Ambulatory Visit: Payer: Self-pay

## 2017-04-21 ENCOUNTER — Telehealth: Payer: Self-pay

## 2017-04-21 ENCOUNTER — Telehealth: Payer: Self-pay | Admitting: Hematology

## 2017-04-21 ENCOUNTER — Ambulatory Visit: Admission: RE | Admit: 2017-04-21 | Payer: Medicare Other | Source: Ambulatory Visit

## 2017-04-21 ENCOUNTER — Encounter (HOSPITAL_COMMUNITY): Payer: Self-pay

## 2017-04-21 DIAGNOSIS — Z833 Family history of diabetes mellitus: Secondary | ICD-10-CM | POA: Diagnosis not present

## 2017-04-21 DIAGNOSIS — Z888 Allergy status to other drugs, medicaments and biological substances status: Secondary | ICD-10-CM | POA: Diagnosis not present

## 2017-04-21 DIAGNOSIS — F419 Anxiety disorder, unspecified: Secondary | ICD-10-CM | POA: Diagnosis present

## 2017-04-21 DIAGNOSIS — D509 Iron deficiency anemia, unspecified: Secondary | ICD-10-CM | POA: Diagnosis present

## 2017-04-21 DIAGNOSIS — Z825 Family history of asthma and other chronic lower respiratory diseases: Secondary | ICD-10-CM | POA: Diagnosis not present

## 2017-04-21 DIAGNOSIS — Z818 Family history of other mental and behavioral disorders: Secondary | ICD-10-CM

## 2017-04-21 DIAGNOSIS — Z7952 Long term (current) use of systemic steroids: Secondary | ICD-10-CM | POA: Diagnosis not present

## 2017-04-21 DIAGNOSIS — Z931 Gastrostomy status: Secondary | ICD-10-CM | POA: Diagnosis not present

## 2017-04-21 DIAGNOSIS — R000 Tachycardia, unspecified: Secondary | ICD-10-CM | POA: Diagnosis present

## 2017-04-21 DIAGNOSIS — E860 Dehydration: Secondary | ICD-10-CM | POA: Diagnosis present

## 2017-04-21 DIAGNOSIS — R195 Other fecal abnormalities: Secondary | ICD-10-CM | POA: Diagnosis present

## 2017-04-21 DIAGNOSIS — F319 Bipolar disorder, unspecified: Secondary | ICD-10-CM | POA: Diagnosis present

## 2017-04-21 DIAGNOSIS — Z8249 Family history of ischemic heart disease and other diseases of the circulatory system: Secondary | ICD-10-CM | POA: Diagnosis not present

## 2017-04-21 DIAGNOSIS — Z809 Family history of malignant neoplasm, unspecified: Secondary | ICD-10-CM | POA: Diagnosis not present

## 2017-04-21 DIAGNOSIS — D649 Anemia, unspecified: Secondary | ICD-10-CM

## 2017-04-21 DIAGNOSIS — R55 Syncope and collapse: Secondary | ICD-10-CM

## 2017-04-21 DIAGNOSIS — D62 Acute posthemorrhagic anemia: Secondary | ICD-10-CM | POA: Diagnosis present

## 2017-04-21 DIAGNOSIS — C155 Malignant neoplasm of lower third of esophagus: Secondary | ICD-10-CM | POA: Diagnosis present

## 2017-04-21 DIAGNOSIS — K219 Gastro-esophageal reflux disease without esophagitis: Secondary | ICD-10-CM | POA: Diagnosis present

## 2017-04-21 DIAGNOSIS — Z801 Family history of malignant neoplasm of trachea, bronchus and lung: Secondary | ICD-10-CM | POA: Diagnosis not present

## 2017-04-21 DIAGNOSIS — K922 Gastrointestinal hemorrhage, unspecified: Secondary | ICD-10-CM | POA: Insufficient documentation

## 2017-04-21 DIAGNOSIS — Z87891 Personal history of nicotine dependence: Secondary | ICD-10-CM

## 2017-04-21 LAB — CBC WITH DIFFERENTIAL/PLATELET
HEMATOCRIT: 18.9 % — AB (ref 39.0–52.0)
HEMOGLOBIN: 6.6 g/dL — AB (ref 13.0–17.0)
MCH: 28.9 pg (ref 26.0–34.0)
MCHC: 34.9 g/dL (ref 30.0–36.0)
MCV: 82.9 fL (ref 78.0–100.0)
Platelets: 171 10*3/uL (ref 150–400)
RBC: 2.28 MIL/uL — ABNORMAL LOW (ref 4.22–5.81)
RDW: 18.2 % — AB (ref 11.5–15.5)
WBC: 3.7 10*3/uL — ABNORMAL LOW (ref 4.0–10.5)

## 2017-04-21 LAB — COMPREHENSIVE METABOLIC PANEL
ALK PHOS: 65 U/L (ref 38–126)
ALT: 17 U/L (ref 17–63)
AST: 19 U/L (ref 15–41)
Albumin: 2.3 g/dL — ABNORMAL LOW (ref 3.5–5.0)
Anion gap: 9 (ref 5–15)
BILIRUBIN TOTAL: 0.2 mg/dL — AB (ref 0.3–1.2)
BUN: 25 mg/dL — AB (ref 6–20)
CALCIUM: 8.2 mg/dL — AB (ref 8.9–10.3)
CO2: 24 mmol/L (ref 22–32)
CREATININE: 0.7 mg/dL (ref 0.61–1.24)
Chloride: 95 mmol/L — ABNORMAL LOW (ref 101–111)
GFR calc Af Amer: >60 mL/min (ref >60)
Glucose, Bld: 113 mg/dL — ABNORMAL HIGH (ref 65–99)
Potassium: 4.4 mmol/L (ref 3.5–5.1)
Sodium: 128 mmol/L — ABNORMAL LOW (ref 135–145)
TOTAL PROTEIN: 5.4 g/dL — AB (ref 6.5–8.1)

## 2017-04-21 LAB — ABO/RH: ABO/RH(D): O NEG

## 2017-04-21 LAB — FERRITIN: Ferritin: 1834 ng/mL — ABNORMAL HIGH (ref 24–336)

## 2017-04-21 LAB — URINALYSIS, ROUTINE W REFLEX MICROSCOPIC
BILIRUBIN URINE: NEGATIVE
Glucose, UA: NEGATIVE mg/dL
Hgb urine dipstick: NEGATIVE
Ketones, ur: NEGATIVE mg/dL
LEUKOCYTES UA: NEGATIVE
Nitrite: NEGATIVE
Protein, ur: NEGATIVE mg/dL
SPECIFIC GRAVITY, URINE: 1.013 (ref 1.005–1.030)
pH: 6 (ref 5.0–8.0)

## 2017-04-21 LAB — PREPARE RBC (CROSSMATCH)

## 2017-04-21 LAB — RETICULOCYTES
RBC.: 2.22 MIL/uL — AB (ref 4.22–5.81)
Retic Count, Absolute: 26.6 10*3/uL (ref 19.0–186.0)
Retic Ct Pct: 1.2 % (ref 0.4–3.1)

## 2017-04-21 LAB — IRON AND TIBC
Iron: 19 ug/dL — ABNORMAL LOW (ref 45–182)
Saturation Ratios: 14 % — ABNORMAL LOW (ref 17.9–39.5)
TIBC: 137 ug/dL — AB (ref 250–450)
UIBC: 118 ug/dL

## 2017-04-21 LAB — CBG MONITORING, ED: GLUCOSE-CAPILLARY: 118 mg/dL — AB (ref 65–99)

## 2017-04-21 LAB — POC OCCULT BLOOD, ED: FECAL OCCULT BLD: NEGATIVE

## 2017-04-21 LAB — FOLATE: Folate: 17.7 ng/mL (ref >5.9)

## 2017-04-21 LAB — VITAMIN B12: VITAMIN B 12: 601 pg/mL (ref 180–914)

## 2017-04-21 MED ORDER — ACETAMINOPHEN 325 MG PO TABS: 650.0000 mg | ORAL_TABLET | Freq: Four times a day (QID) | ORAL | Status: DC | PRN

## 2017-04-21 MED ORDER — ONDANSETRON HCL 4 MG/2ML IJ SOLN: 4.0000 mg | Freq: Four times a day (QID) | INTRAMUSCULAR | Status: DC | PRN

## 2017-04-21 MED ORDER — SODIUM CHLORIDE 0.9 % IV SOLN
INTRAVENOUS | Status: AC
  Administered 2017-04-21: 22:00:00 via INTRAVENOUS

## 2017-04-21 MED ORDER — HYDROCODONE-ACETAMINOPHEN 7.5-325 MG/15ML PO SOLN: 15.0000 mL | Freq: Four times a day (QID) | ORAL | Status: DC | PRN

## 2017-04-21 MED ORDER — LEVALBUTEROL HCL 0.63 MG/3ML IN NEBU: 0.6300 mg | INHALATION_SOLUTION | Freq: Four times a day (QID) | RESPIRATORY_TRACT | Status: DC | PRN

## 2017-04-21 MED ORDER — ACETAMINOPHEN 650 MG RE SUPP: 650.0000 mg | Freq: Four times a day (QID) | RECTAL | Status: DC | PRN

## 2017-04-21 MED ORDER — SODIUM CHLORIDE 0.9% FLUSH
10.0000 mL | INTRAVENOUS | Status: DC | PRN
  Administered 2017-04-22: 10 mL
  Filled 2017-04-21: qty 40

## 2017-04-21 MED ORDER — SODIUM CHLORIDE 0.9 % IV SOLN
INTRAVENOUS | Status: DC
  Administered 2017-04-21: 13:00:00 via INTRAVENOUS

## 2017-04-21 MED ORDER — PAROXETINE HCL 20 MG PO TABS
40.0000 mg | ORAL_TABLET | Freq: Every evening | ORAL | Status: DC
  Administered 2017-04-21: 40 mg via ORAL
  Filled 2017-04-21: qty 2

## 2017-04-21 MED ORDER — DEXAMETHASONE 4 MG PO TABS
4.0000 mg | ORAL_TABLET | Freq: Two times a day (BID) | ORAL | Status: DC
  Administered 2017-04-22: 4 mg via ORAL
  Filled 2017-04-21 (×2): qty 1

## 2017-04-21 MED ORDER — PANTOPRAZOLE SODIUM 40 MG PO TBEC
40.0000 mg | DELAYED_RELEASE_TABLET | Freq: Two times a day (BID) | ORAL | Status: DC
  Administered 2017-04-22: 40 mg via ORAL
  Filled 2017-04-21: qty 1

## 2017-04-21 MED ORDER — SODIUM CHLORIDE 0.9 % IV SOLN: 10.0000 mL/h | Freq: Once | INTRAVENOUS | Status: DC

## 2017-04-21 MED ORDER — ONDANSETRON HCL 4 MG PO TABS: 4.0000 mg | ORAL_TABLET | Freq: Four times a day (QID) | ORAL | Status: DC | PRN

## 2017-04-21 MED ORDER — PANTOPRAZOLE SODIUM 40 MG IV SOLR
40.0000 mg | Freq: Once | INTRAVENOUS | Status: AC
  Administered 2017-04-21: 40 mg via INTRAVENOUS
  Filled 2017-04-21: qty 40

## 2017-04-21 MED ORDER — QUETIAPINE FUMARATE 200 MG PO TABS
600.0000 mg | ORAL_TABLET | Freq: Every day | ORAL | Status: DC
  Administered 2017-04-21: 600 mg via ORAL
  Filled 2017-04-21: qty 3

## 2017-04-21 MED ORDER — OXYCODONE HCL 5 MG PO TABS: 5.0000 mg | ORAL_TABLET | ORAL | Status: DC | PRN

## 2017-04-21 MED ORDER — SODIUM CHLORIDE 0.9 % IV BOLUS (SEPSIS)
1000.0000 mL | Freq: Once | INTRAVENOUS | Status: AC
Start: 2017-04-21 — End: 2017-04-21
  Administered 2017-04-21: 1000 mL via INTRAVENOUS

## 2017-04-21 NOTE — ED Notes (Signed)
Ginger [id number]. Report [id number]

## 2017-04-21 NOTE — ED Notes (Signed)
MD at bedside. 

## 2017-04-21 NOTE — Telephone Encounter (Signed)
Called patient regarding october

## 2017-04-21 NOTE — Telephone Encounter (Signed)
Madelyn ex-wife called that pt got up to go to BR about 0430. He passed out. He was in the doorway of bathroom when she found him. He was incontinent urine.  Responded within a minute. Was not "with it" for about 3-4 minutes. She said pulse was >160. As he was crawling back to the bed he passed out again. Had Dudley recheck pulse while on the phone and it is 120. He is currently alert and oriented. He has been up to BR again this AM and he got lightheaded but did not pass out.   S/w Sandi Mealy NP and instructed Madelyn to take him to ER. If she has trouble getting him to the car call for an ambulance.

## 2017-04-21 NOTE — ED Provider Notes (Signed)
[facility] DEPT Provider Note   CSN: [id number] Arrival date & time: 04/21/17  1126     History   Chief Complaint Chief Complaint  Patient presents with  . Loss of Consciousness    HPI Kurt Fisher is a 56 y.o. male.  Patient followed by hematology oncology clinic. Patient on the way to the bathroom this morning passed out. Patient's had decreased fluid intake and appetite now for 2 weeks. Recently had a G-tube placed and has at least stabilized his weight loss with that. Patient has a history esophageal CA and is receiving chemotherapy for it. Patient was due for chemotherapy again tomorrow. Patient's wife felt there was some darkish drainage from the NG tube. Patient denies any blood per rectum. Patient states that earlier and yesterday he did not feel any significant worsen his baseline.      Past Medical History:  Diagnosis Date  . Anxiety   . Bipolar 1 disorder ([place])   . Depression   . Esophageal cancer ([place]) 02/16/2017  . GERD (gastroesophageal reflux disease)     Patient Active Problem List   Diagnosis Date Noted  . Bipolar 1 disorder ([place]) 04/04/2017  . Iron deficiency anemia   . Malignant neoplasm of lower third of esophagus ([place]) 02/16/2017  . Loss of weight 01/27/2017  . Guaiac positive stools 01/27/2017  . Gastroesophageal reflux disease without esophagitis 01/27/2017  . GERD (gastroesophageal reflux disease) 01/22/2016  . Anxiety     Past Surgical History:  Procedure Laterality Date  . BIOPSY  02/09/2017   Procedure: BIOPSY;  Surgeon: Rogene Houston, MD;  [facility];  Service: Endoscopy;;  gastric/GE Junction/distal esophagus bx   . COLONOSCOPY N/A 02/09/2017   Procedure: COLONOSCOPY;  Surgeon: Rogene Houston, MD;  Location: [facility];  Service: Endoscopy;  Laterality: N/A;  . ESOPHAGOGASTRODUODENOSCOPY N/A 02/09/2017   Procedure: ESOPHAGOGASTRODUODENOSCOPY (EGD);  Surgeon: Rogene Houston, MD;  Location: [facility];   Service: Endoscopy;  Laterality: N/A;  2:40-moved up to 1255 per Lelon Frohlich  . IR FLUORO GUIDE PORT INSERTION RIGHT  03/07/2017  . IR GASTROSTOMY TUBE MOD SED  03/07/2017  . IR PATIENT EVAL TECH 0-60 MINS  03/18/2017  . IR US GUIDE VASC ACCESS RIGHT  03/07/2017       Home Medications    Prior to Admission medications   Medication Sig Start Date End Date Taking? Authorizing Provider  bismuth subsalicylate (PEPTO BISMOL) 262 MG/15ML suspension Take 30 mLs by mouth every 6 (six) hours as needed for indigestion or diarrhea or loose stools.   Yes [provider]  dexamethasone (DECADRON) 4 MG tablet Take 1 tablet (4 mg total) by mouth 2 (two) times daily with a meal. 03/30/17  Yes Truitt Merle, MD  emollient (BIAFINE) cream Apply 1 application topically 2 (two) times daily. Apply to chest area after rad tx daily and prn,nothing 4 hours prior to rad txs, 03/24/17  Yes Hayden Pedro, PA-C  ENSURE PLUS (ENSURE PLUS) LIQD Take 237 mLs by mouth 3 (three) times daily between meals.   Yes [provider]  HYDROcodone-acetaminophen (HYCET) 7.5-325 mg/15 ml solution Take 15 mLs by mouth every 6 (six) hours as needed for moderate pain. 04/11/17  Yes Truitt Merle, MD  lidocaine-prilocaine (EMLA) cream Apply 1 application topically as needed. Apply to portacath  1 1/2 hours to 2 hours prior to procedures as needed. 03/22/17  Yes Truitt Merle, MD  Nutritional Supplements (FEEDING SUPPLEMENT, OSMOLITE 1.5 CAL,) LIQD Give 1  1/2 cans of tube feeding via feeding tube 4 times per day.  Flush tube with 77ml of water before and after feeding.   Start with 1/2 can of tube feeding 4 times per day and increase by 1/2 can daily until goal rate of 6 cans per day reached. Send bolus tube feeding supplies. 03/04/17  Yes Holley Bouche, NP  ondansetron (ZOFRAN) 4 MG tablet TAKE 2 TABLETS BY MOUTH EVERY 8 HOURS AS NEEDED FOR NAUSEA OR VOMITING 04/12/17  Yes Truitt Merle, MD  pantoprazole (PROTONIX) 40 MG tablet Take 1 tablet  (40 mg total) by mouth 2 (two) times daily before a meal. 01/19/17  Yes Setzer, Terri L, NP  PARoxetine (PAXIL) 40 MG tablet Take 40 mg by mouth every evening.    Yes [provider]  prochlorperazine (COMPAZINE) 10 MG tablet TAKE 1 TABLET BY MOUTH EVERY 6 HOURS AS NEEDED FOR NAUSEA OR VOMITING 04/12/17  Yes Truitt Merle, MD  QUEtiapine (SEROQUEL) 300 MG tablet Take 600 mg by mouth at bedtime.    Yes [provider]    Family History Family History  Problem Relation Age of Onset  . Asthma Daughter   . Cancer Mother 47       lung  . Cancer Father   . Diabetes Father   . Diabetes Brother   . Heart attack Brother   . Cancer Paternal Grandmother   . Asthma Daughter   . Fibromyalgia Daughter   . Migraines Daughter   . Anxiety disorder Daughter   . Mood Disorder Daughter   . Panic disorder Daughter   . OCD Daughter     Social History Social History  Substance Use Topics  . Smoking status: Former Smoker    Types: Cigarettes    Quit date: 02/17/2003  . Smokeless tobacco: Never Used     Comment: quit 14 yrs ago  . Alcohol use No     Allergies   Taxol [paclitaxel]   Review of Systems Review of Systems  Constitutional: Positive for appetite change and fatigue.  HENT: Negative for congestion.   Eyes: Negative for redness.  Respiratory: Negative for shortness of breath.   Cardiovascular: Negative for chest pain.  Gastrointestinal: Negative for abdominal pain, blood in stool, nausea and vomiting.  Genitourinary: Negative for dysuria.  Musculoskeletal: Negative for back pain.  Skin: Negative for rash.  Neurological: Negative for headaches.  Hematological: Does not bruise/bleed easily.  Psychiatric/Behavioral: Negative for confusion.     Physical Exam Updated Vital Signs BP 106/61 (BP Location: Right Arm)   Pulse (!) 115   Temp 98.4 F (36.9 C) (Oral)   Resp 18   SpO2 100%   Physical Exam  Constitutional: He is oriented to person, place, and time. He  appears well-developed and well-nourished. No distress.  HENT:  Head: Normocephalic and atraumatic.  Mouth/Throat: Oropharynx is clear and moist.  Eyes: Pupils are equal, round, and reactive to light. EOM are normal.  Neck: Normal range of motion. Neck supple.  Cardiovascular: Regular rhythm.   Tachycardic  Pulmonary/Chest: Effort normal and breath sounds normal. No respiratory distress.  Abdominal: Soft. Bowel sounds are normal. There is no tenderness.  Genitourinary: Rectum normal. Rectal exam shows guaiac negative stool.  Neurological: He is alert and oriented to person, place, and time. No cranial nerve deficit or sensory deficit. He exhibits normal muscle tone. Coordination normal.  Skin: Skin is warm. There is pallor.  Nursing note and vitals reviewed.    ED Treatments / Results  Labs (all labs ordered are listed, but only abnormal results are displayed) Labs Reviewed  COMPREHENSIVE METABOLIC PANEL - Abnormal; Notable for the following:       Result Value   Sodium 128 (*)    Chloride 95 (*)    Glucose, Bld 113 (*)    BUN 25 (*)    Calcium 8.2 (*)    Total Protein 5.4 (*)    Albumin 2.3 (*)    Total Bilirubin 0.2 (*)    All other components within normal limits  CBC WITH DIFFERENTIAL/PLATELET - Abnormal; Notable for the following:    WBC 3.7 (*)    RBC 2.28 (*)    Hemoglobin 6.6 (*)    HCT 18.9 (*)    RDW 18.2 (*)    All other components within normal limits  CBG MONITORING, ED - Abnormal; Notable for the following:    Glucose-Capillary 118 (*)    All other components within normal limits  URINALYSIS, ROUTINE W REFLEX MICROSCOPIC  POC OCCULT BLOOD, ED  TYPE AND SCREEN  PREPARE RBC (CROSSMATCH)    EKG  EKG Interpretation None       ED ECG REPORT   Date: 04/21/2017  Rate: 113  Rhythm: sinus tachycardia  QRS Axis: normal  Intervals: normal  ST/T Wave abnormalities: normal  Conduction Disutrbances:none  Narrative Interpretation:   Old EKG Reviewed:  none available  I have personally reviewed the EKG tracing and agree with the computerized printout as noted.   Radiology No results found.  Procedures Procedures (including critical care time)  CRITICAL CARE Performed by: Jacobus Colvin Total critical care time: 30 minutes Critical care time was exclusive of separately billable procedures and treating other patients. Critical care was necessary to treat or prevent imminent or life-threatening deterioration. Critical care was time spent personally by me on the following activities: development of treatment plan with patient and/or surrogate as well as nursing, discussions with consultants, evaluation of patient's response to treatment, examination of patient, obtaining history from patient or surrogate, ordering and performing treatments and interventions, ordering and review of laboratory studies, ordering and review of radiographic studies, pulse oximetry and re-evaluation of patient's condition.   Medications Ordered in ED Medications  0.9 %  sodium chloride infusion ( Intravenous New Bag/Given 04/21/17 1315)  pantoprazole (PROTONIX) injection 40 mg (not administered)  0.9 %  sodium chloride infusion (not administered)  sodium chloride 0.9 % bolus 1,000 mL (0 mLs Intravenous Stopped 04/21/17 1311)     Initial Impression / Assessment and Plan / ED Course  I have reviewed the triage vital signs and the nursing notes.  Pertinent labs & imaging results that were available during my care of the patient were reviewed by me and considered in my medical decision making (see chart for details).    Patient arrived tachycardic initial blood pressure was 85 systolic. Patient started on fluids. Laying down blood pressure came up to systolic of 623. Patient in no acute distress laying down. Patient's hemoglobin just from 2 days ago came in at 6.6. Where it was above 8 prior. No evidence of any blood loss per rectum. Some hint of maybe some  bleeding in the stomach from the G-tube with no frank blood.  Patient clearly has had some blood loss. Patient improved here with IV fluids. 2 units of blood ordered. Discussed with hospitalist they will admit. Patient does not have a local GI doctor. His GI doctor is in [place] Dr. Melony Overly.  Patient followed by hematology oncology was  just seen there on the 25th.   Final Clinical Impressions(s) / ED Diagnoses   Final diagnoses:  Symptomatic anemia  Syncope, unspecified syncope type  Gastrointestinal hemorrhage, unspecified gastrointestinal hemorrhage type    New Prescriptions New Prescriptions   No medications on file     Fredia Sorrow, MD 04/21/17 [phone]

## 2017-04-21 NOTE — ED Triage Notes (Addendum)
Pt reports "passing out" twice this morning. Pt and family member states having decreased appetite x2 weeks. Pt's family member states pt has a g-tube that had some darker than normal drainage today and was unable to aspirate anything this morning. Pt has esophageal CA and is getting chemo for it.

## 2017-04-21 NOTE — ED Notes (Signed)
ED TO INPATIENT HANDOFF REPORT  Name/Age/Gender Kurt Fisher 56 y.o. male  Code Status    Code Status Orders        Start     Ordered   04/21/17 1405  Full code  Continuous     04/21/17 1406    Code Status History    Date Active Date Inactive Code Status Order ID Comments User Context   This patient has a current code status but no historical code status.      Home/SNF/Other Home  Chief Complaint cancer pt / syncope / Gtube issue   Level of Care/Admitting Diagnosis ED Disposition    ED Disposition Condition Comment   Admit  Hospital Area: [facility] [[id number]]  Level of Care: Telemetry [5]  Admit to tele based on following criteria: Eval of Syncope  Diagnosis: Acute blood loss anemia [448185]  Admitting Physician: Reyne Dumas [[id number]]  Attending Physician: Reyne Dumas [[id number]]  Estimated length of stay: past midnight tomorrow  Certification:: I certify this patient will need inpatient services for at least 2 midnights  PT Class (Do Not Modify): Inpatient [101]  PT Acc Code (Do Not Modify): Private [1]       Medical History Past Medical History:  Diagnosis Date  . Anxiety   . Bipolar 1 disorder ([place])   . Depression   . Esophageal cancer ([place]) 02/16/2017  . GERD (gastroesophageal reflux disease)     Allergies Allergies  Allergen Reactions  . Taxol [Paclitaxel] Other (See Comments)    Flushing, lightheaded and nausea    IV Location/Drains/Wounds Patient Lines/Drains/Airways Status   Active Line/Drains/Airways    Name:   Placement date:   Placement time:   Site:   Days:   Implanted Port 03/07/17 Right Chest  03/07/17    1215    Chest    45   Gastrostomy/Enterostomy Gastrostomy 18 Fr. LUQ  03/07/17    1142    LUQ    45          Labs/Imaging Results for orders placed or performed during the hospital encounter of 04/21/17 (from the past 48 hour(s))  CBG monitoring, ED     Status: Abnormal   Collection Time: 04/21/17 11:56 AM   Result Value Ref Range   Glucose-Capillary 118 (H) 65 - 99 mg/dL  Comprehensive metabolic panel     Status: Abnormal   Collection Time: 04/21/17 12:07 PM  Result Value Ref Range   Sodium 128 (L) 135 - 145 mmol/L   Potassium 4.4 3.5 - 5.1 mmol/L   Chloride 95 (L) 101 - 111 mmol/L   CO2 24 22 - 32 mmol/L   Glucose, Bld 113 (H) 65 - 99 mg/dL   BUN 25 (H) 6 - 20 mg/dL   Creatinine, Ser 0.70 0.61 - 1.24 mg/dL   Calcium 8.2 (L) 8.9 - 10.3 mg/dL   Total Protein 5.4 (L) 6.5 - 8.1 g/dL   Albumin 2.3 (L) 3.5 - 5.0 g/dL   AST 19 15 - 41 U/L   ALT 17 17 - 63 U/L   Alkaline Phosphatase 65 38 - 126 U/L   Total Bilirubin 0.2 (L) 0.3 - 1.2 mg/dL   GFR calc non Af Amer >60 >60 mL/min   GFR calc Af Amer >60 >60 mL/min    Comment: (NOTE) The eGFR has been calculated using the CKD EPI equation. This calculation has not been validated in all clinical situations. eGFR's persistently <60 mL/min signify possible Chronic Kidney Disease.  Anion gap 9 5 - 15  CBC with Differential/Platelet     Status: Abnormal   Collection Time: 04/21/17 12:07 PM  Result Value Ref Range   WBC 3.7 (L) 4.0 - 10.5 K/uL   RBC 2.28 (L) 4.22 - 5.81 MIL/uL   Hemoglobin 6.6 (LL) 13.0 - 17.0 g/dL    Comment: CRITICAL RESULT CALLED TO, READ BACK BY AND VERIFIED WITH: RAND,K @ 1301 ON [id number] BY MCCOY,N    HCT 18.9 (L) 39.0 - 52.0 %   MCV 82.9 78.0 - 100.0 fL   MCH 28.9 26.0 - 34.0 pg   MCHC 34.9 30.0 - 36.0 g/dL   RDW 18.2 (H) 11.5 - 15.5 %   Platelets 171 150 - 400 K/uL  Reticulocytes     Status: Abnormal   Collection Time: 04/21/17 12:07 PM  Result Value Ref Range   Retic Ct Pct 1.2 0.4 - 3.1 %   RBC. 2.22 (L) 4.22 - 5.81 MIL/uL   Retic Count, Absolute 26.6 19.0 - 186.0 K/uL  Urinalysis, Routine w reflex microscopic     Status: None   Collection Time: 04/21/17 12:54 PM  Result Value Ref Range   Color, Urine YELLOW YELLOW   APPearance CLEAR CLEAR   Specific Gravity, Urine 1.013 1.005 - 1.030   pH 6.0 5.0 - 8.0    Glucose, UA NEGATIVE NEGATIVE mg/dL   Hgb urine dipstick NEGATIVE NEGATIVE   Bilirubin Urine NEGATIVE NEGATIVE   Ketones, ur NEGATIVE NEGATIVE mg/dL   Protein, ur NEGATIVE NEGATIVE mg/dL   Nitrite NEGATIVE NEGATIVE   Leukocytes, UA NEGATIVE NEGATIVE  Type and screen Hand     Status: None (Preliminary result)   Collection Time: 04/21/17  1:09 PM  Result Value Ref Range   ABO/RH(D) O NEG    Antibody Screen NEG    Sample Expiration 04/24/2017    Unit Number [id number]    Blood Component Type RCLI PHER 2    Unit division 00    Status of Unit ISSUED    Transfusion Status OK TO TRANSFUSE    Crossmatch Result Compatible    Unit Number [id number]    Blood Component Type RCLI PHER 1    Unit division 00    Status of Unit ALLOCATED    Transfusion Status OK TO TRANSFUSE    Crossmatch Result Compatible   Prepare RBC     Status: None   Collection Time: 04/21/17  1:09 PM  Result Value Ref Range   Order Confirmation ORDER PROCESSED BY BLOOD BANK   ABO/Rh     Status: None   Collection Time: 04/21/17  1:09 PM  Result Value Ref Range   ABO/RH(D) O NEG   POC occult blood, ED     Status: None   Collection Time: 04/21/17  1:30 PM  Result Value Ref Range   Fecal Occult Bld NEGATIVE NEGATIVE  Vitamin B12     Status: None   Collection Time: 04/21/17  2:15 PM  Result Value Ref Range   Vitamin B-12 601 180 - 914 pg/mL    Comment: (NOTE) This assay is not validated for testing neonatal or myeloproliferative syndrome specimens for Vitamin B12 levels. Performed at [facility], [place] [address] [place], [place] [address]   Folate     Status: None   Collection Time: 04/21/17  2:15 PM  Result Value Ref Range   Folate 17.7 >5.9 ng/mL    Comment: Performed at [facility]  [address], [place], [place] [address]  Iron and TIBC     Status: Abnormal   Collection Time: 04/21/17  2:15 PM  Result Value Ref Range   Iron 19 (L) 45 - 182 ug/dL   TIBC 137  (L) 250 - 450 ug/dL   Saturation Ratios 14 (L) 17.9 - 39.5 %   UIBC 118 ug/dL    Comment: Performed at [facility] [address], [place], [place] [address]  Ferritin     Status: Abnormal   Collection Time: 04/21/17  2:15 PM  Result Value Ref Range   Ferritin 1,834 (H) 24 - 336 ng/mL    Comment: Performed at [facility], [place] [address], [place], [place] [address]   No results found.  Pending Labs [employer]    Start     Ordered   04/21/17 1404  HIV antibody (Routine Testing)  Once,   R     04/21/17 1406      Vitals/Pain Today's Vitals   04/21/17 1400 04/21/17 1611 04/21/17 1643 04/21/17 1730  BP: (!) 92/46 105/68 107/77 (!) 107/57  Pulse: (!) 114 (!) 117 (!) 118 (!) 113  Resp: (!) _0 (!) 23  Temp:  99.3 F (37.4 C) 99.7 F (37.6 C)   TempSrc:  Oral Oral   SpO2: 100% 100% 99% 99%  Weight:      Height:      PainSc:        Isolation Precautions No active isolations  Medications Medications  0.9 %  sodium chloride infusion ( Intravenous New Bag/Given 04/21/17 1315)  0.9 %  sodium chloride infusion (0 mL/hr Intravenous Hold 04/21/17 1400)  acetaminophen (TYLENOL) tablet 650 mg (not administered)    Or  acetaminophen (TYLENOL) suppository 650 mg (not administered)  oxyCODONE (Oxy IR/ROXICODONE) immediate release tablet 5 mg (not administered)  ondansetron (ZOFRAN) tablet 4 mg (not administered)    Or  ondansetron (ZOFRAN) injection 4 mg (not administered)  levalbuterol (XOPENEX) nebulizer solution 0.63 mg (not administered)  sodium chloride 0.9 % bolus 1,000 mL (0 mLs Intravenous Stopped 04/21/17 1311)  pantoprazole (PROTONIX) injection 40 mg (40 mg Intravenous Given 04/21/17 1353)    Mobility walks

## 2017-04-21 NOTE — H&P (Signed)
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 Pecola Leisure last chemo was on 04/11/17 and he required IVF on 04/14/17.   Per G-tube he tolerates 2 cans osmolite 1.5 per feeding 3 times per day with 120 cc free water with each feeding. Patient   brought in by ex-wife 27-Apr-2023 as the patient had passed out. Patient was in the doorway of the bathroom when she found him. Patient was found to be incontinent of urine, unresponsive for less than a minute. Patient was unable to get up and walk back to the bed. Patient felt lightheaded and dizzy , EMS was called and patient was brought in. G-tube has had darker than normal drainage. Patient also unable to aspirate anything via G-tube this morning. Patient denied any melena, hematochezia ED course BP 106/61 (BP Location: Right Arm)   Pulse (!) 115   Temp 98.4 F (36.9 C) (Oral)   Resp 18   SpO2 100%  Hemoglobin drop from 10.1 >8.9->6.6. Sodium 128, creatinine 0.7 Patient is being admitted to telemetry for PRBC transfusion, workup of his syncope    Review of Systems: negative for the following   A complete 12 point review of systems was done with pertinent positives documented in history of present illness    Past Medical History:  Diagnosis Date  . Anxiety   . Bipolar 1 disorder ([place])   . Depression   . Esophageal cancer ([place]) 02/16/2017  . GERD (gastroesophageal reflux disease)      Past Surgical History:  Procedure Laterality Date  . BIOPSY  02/09/2017   Procedure: BIOPSY;  Surgeon: Rogene [place], MD;  [facility];  Service: Endoscopy;;   gastric/GE Junction/distal esophagus bx   . COLONOSCOPY N/A 02/09/2017   Procedure: COLONOSCOPY;  Surgeon: Rogene [place], MD;  [facility];  Service: Endoscopy;  Laterality: N/A;  . ESOPHAGOGASTRODUODENOSCOPY N/A 02/09/2017   Procedure: ESOPHAGOGASTRODUODENOSCOPY (EGD);  Surgeon: Rogene [place], MD;  [facility];  Service: Endoscopy;  Laterality: N/A;  2:40-moved up to 1255 per Lelon Frohlich  . IR FLUORO GUIDE PORT INSERTION RIGHT  03/07/2017  . IR GASTROSTOMY TUBE MOD SED  03/07/2017  . IR PATIENT EVAL TECH 0-60 MINS  03/18/2017  . IR US GUIDE VASC ACCESS RIGHT  03/07/2017      Social History:  reports that he quit smoking about 14 years ago. His smoking use included Cigarettes. He has never used smokeless tobacco. He reports that he uses drugs, including Marijuana, about 7 times per week. He reports that he does not drink alcohol.    Allergies  Allergen Reactions  . Taxol [Paclitaxel] Other (See Comments)    Flushing, lightheaded and nausea    Family History  Problem Relation Age of Onset  . Asthma Daughter   . Cancer Mother 37       lung  . Cancer Father   . Diabetes Father   . Diabetes Brother   . Heart attack Brother   . Cancer Paternal Grandmother   . Asthma Daughter   . Fibromyalgia Daughter   . Migraines Daughter   . Anxiety disorder Daughter   .  Mood Disorder Daughter   . Panic disorder Daughter   . OCD Daughter         Prior to Admission medications   Medication Sig Start Date End Date Taking? Authorizing Provider  bismuth subsalicylate (PEPTO BISMOL) 262 MG/15ML suspension Take 30 mLs by mouth every 6 (six) hours as needed for indigestion or diarrhea or loose stools.   Yes [provider]  dexamethasone (DECADRON) 4 MG tablet Take 1 tablet (4 mg total) by mouth 2 (two) times daily with a meal. 03/30/17  Yes Truitt Merle, MD  emollient (BIAFINE) cream Apply 1 application topically 2 (two) times daily. Apply to chest area after rad tx daily and  prn,nothing 4 hours prior to rad txs, 03/24/17  Yes Hayden Pedro, PA-C  ENSURE PLUS (ENSURE PLUS) LIQD Take 237 mLs by mouth 3 (three) times daily between meals.   Yes [provider]  HYDROcodone-acetaminophen (HYCET) 7.5-325 mg/15 ml solution Take 15 mLs by mouth every 6 (six) hours as needed for moderate pain. 04/11/17  Yes Truitt Merle, MD  lidocaine-prilocaine (EMLA) cream Apply 1 application topically as needed. Apply to portacath  1 1/2 hours to 2 hours prior to procedures as needed. 03/22/17  Yes Truitt Merle, MD  Nutritional Supplements (FEEDING SUPPLEMENT, OSMOLITE 1.5 CAL,) LIQD Give 1 1/2 cans of tube feeding via feeding tube 4 times per day.  Flush tube with 22ml of water before and after feeding.   Start with 1/2 can of tube feeding 4 times per day and increase by 1/2 can daily until goal rate of 6 cans per day reached. Send bolus tube feeding supplies. 03/04/17  Yes Holley Bouche, NP  ondansetron (ZOFRAN) 4 MG tablet TAKE 2 TABLETS BY MOUTH EVERY 8 HOURS AS NEEDED FOR NAUSEA OR VOMITING 04/12/17  Yes Truitt Merle, MD  pantoprazole (PROTONIX) 40 MG tablet Take 1 tablet (40 mg total) by mouth 2 (two) times daily before a meal. 01/19/17  Yes Setzer, Terri L, NP  PARoxetine (PAXIL) 40 MG tablet Take 40 mg by mouth every evening.    Yes [provider]  prochlorperazine (COMPAZINE) 10 MG tablet TAKE 1 TABLET BY MOUTH EVERY 6 HOURS AS NEEDED FOR NAUSEA OR VOMITING 04/12/17  Yes Truitt Merle, MD  QUEtiapine (SEROQUEL) 300 MG tablet Take 600 mg by mouth at bedtime.    Yes [provider]     Physical Exam: Vitals:   04/21/17 1143 04/21/17 1251 04/21/17 1330 04/21/17 1355  BP: (!) 85/57 111/65 106/61   Pulse: (!) 117 (!) 106 (!) 115   Resp: 18 17 18    Temp: 98.4 F (36.9 C)     TempSrc: Oral     SpO2: 100% 99% 100%   Weight:    70.3 kg (155 lb)  Height:    6\' 4"  (1.93 m)        Vitals:   04/21/17 1143 04/21/17 1251 04/21/17 1330 04/21/17 1355  BP: (!)  85/57 111/65 106/61   Pulse: (!) 117 (!) 106 (!) 115   Resp: 18 17 18    Temp: 98.4 F (36.9 C)     TempSrc: Oral     SpO2: 100% 99% 100%   Weight:    70.3 kg (155 lb)  Height:    6\' 4"  (1.93 m)   Constitutional:  Chronically ill-appearing, cachectic Eyes: PERRL, lids and conjunctivae normal ENMT: Mucous membranes are moist. Posterior pharynx clear of any exudate or lesions.Normal dentition.  Neck: normal, supple, no masses, no thyromegaly  Respiratory: clear to auscultation bilaterally, no wheezing, no crackles. Normal respiratory effort. No accessory muscle use.  Cardiovascular: Regular rate and rhythm, no murmurs / rubs / gallops. No extremity edema. 2+ pedal pulses. No carotid bruits.  Abdomen: no tenderness, no masses palpated. No hepatosplenomegaly. Bowel sounds positive.  Musculoskeletal: no clubbing / cyanosis. No joint deformity upper and lower extremities. Good ROM, no contractures. Normal muscle tone.  Skin: no rashes, lesions, ulcers. No induration Neurologic: CN 2-12 grossly intact. Sensation intact, DTR normal. Strength 5/5 in all 4.  Psychiatric: Normal judgment and insight. Alert and oriented x 3. Normal mood.     Labs on Admission: I have personally reviewed following labs and imaging studies  CBC:  Recent Labs Lab 04/19/17 1224 04/21/17 1207  WBC 2.6* 3.7*  NEUTROABS 2.0  --   HGB 8.9* 6.6*  HCT 26.2* 18.9*  MCV 86.3 82.9  PLT 165 149    Basic Metabolic Panel:  Recent Labs Lab 04/19/17 1224 04/21/17 1207  NA 131* 128*  K 3.8 4.4  CL  --  95*  CO2 29 24  GLUCOSE 162* 113*  BUN 13.6 25*  CREATININE 0.7 0.70  CALCIUM 8.8 8.2*    GFR: Estimated Creatinine Clearance: 102.5 mL/min (by C-G formula based on SCr of 0.7 mg/dL).  Liver Function Tests:  Recent Labs Lab 04/19/17 1224 04/21/17 1207  AST 11 19  ALT 10 17  ALKPHOS 70 65  BILITOT 0.25 0.2*  PROT 6.2* 5.4*  ALBUMIN 2.5* 2.3*   No results for input(s): LIPASE, AMYLASE in the last  168 hours. No results for input(s): AMMONIA in the last 168 hours.  Coagulation Profile: No results for input(s): INR, PROTIME in the last 168 hours. No results for input(s): DDIMER in the last 72 hours.  Cardiac Enzymes: No results for input(s): CKTOTAL, CKMB, CKMBINDEX, TROPONINI in the last 168 hours.  BNP (last 3 results) No results for input(s): PROBNP in the last 8760 hours.  HbA1C: No results for input(s): HGBA1C in the last 72 hours. No results found for: HGBA1C   CBG:  Recent Labs Lab 04/21/17 1156  GLUCAP 118*    Lipid Profile: No results for input(s): CHOL, HDL, LDLCALC, TRIG, CHOLHDL, LDLDIRECT in the last 72 hours.  Thyroid Function Tests: No results for input(s): TSH, T4TOTAL, FREET4, T3FREE, THYROIDAB in the last 72 hours.  Anemia Panel: No results for input(s): VITAMINB12, FOLATE, FERRITIN, TIBC, IRON, RETICCTPCT in the last 72 hours.  Urine analysis:    Component Value Date/Time   COLORURINE YELLOW 04/21/2017 1254   APPEARANCEUR CLEAR 04/21/2017 1254   LABSPEC 1.013 04/21/2017 1254   PHURINE 6.0 04/21/2017 1254   GLUCOSEU NEGATIVE 04/21/2017 1254   HGBUR NEGATIVE 04/21/2017 1254   BILIRUBINUR NEGATIVE 04/21/2017 1254   KETONESUR NEGATIVE 04/21/2017 1254   PROTEINUR NEGATIVE 04/21/2017 1254   NITRITE NEGATIVE 04/21/2017 1254   LEUKOCYTESUR NEGATIVE 04/21/2017 1254    Sepsis Labs: @LABRCNTIP (procalcitonin:4,lacticidven:4) )No results found for this or any previous visit (from the past 240 hour(s)).       Radiological Exams on Admission: No results found. No results found.    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       consults called:  Family Communication: Admission, patients condition and plan of care including tests being ordered have been discussed with the patient  who indicates understanding and agree with the plan and Code Status  Admission status: inpatient    Disposition plan: Further plan will depend as patient's clinical course evolves and further radiologic and laboratory data become available. Likely home when stable   At the time of admission, it appears that the appropriate admission status for this patient is INPATIENT .Thisis judged to be reasonable and necessary in order to provide the required intensity of service to ensure the patient's safetygiven thepresenting symptoms, physical exam findings, and initial radiographic and laboratory data in the context of their chronic comorbidities.   Reyne Dumas MD [facility] Pager [phone]  If 7PM-7AM, please contact night-coverage [email or web address] Password [email or web address]  04/21/2017, 2:06 PM

## 2017-04-22 ENCOUNTER — Telehealth: Payer: Self-pay | Admitting: *Deleted

## 2017-04-22 ENCOUNTER — Inpatient Hospital Stay (HOSPITAL_COMMUNITY): Payer: Medicare Other

## 2017-04-22 ENCOUNTER — Ambulatory Visit: Payer: Medicare Other

## 2017-04-22 DIAGNOSIS — R55 Syncope and collapse: Secondary | ICD-10-CM

## 2017-04-22 LAB — COMPREHENSIVE METABOLIC PANEL
ALK PHOS: 64 U/L (ref 38–126)
ALT: 16 U/L — ABNORMAL LOW (ref 17–63)
AST: 17 U/L (ref 15–41)
Albumin: 2.3 g/dL — ABNORMAL LOW (ref 3.5–5.0)
Anion gap: 8 (ref 5–15)
BILIRUBIN TOTAL: 0.4 mg/dL (ref 0.3–1.2)
BUN: 20 mg/dL (ref 6–20)
CALCIUM: 8.3 mg/dL — AB (ref 8.9–10.3)
CO2: 25 mmol/L (ref 22–32)
Chloride: 101 mmol/L (ref 101–111)
Creatinine, Ser: 0.6 mg/dL — ABNORMAL LOW (ref 0.61–1.24)
GFR calc Af Amer: >60 mL/min (ref >60)
Glucose, Bld: 105 mg/dL — ABNORMAL HIGH (ref 65–99)
POTASSIUM: 3.7 mmol/L (ref 3.5–5.1)
Sodium: 134 mmol/L — ABNORMAL LOW (ref 135–145)
TOTAL PROTEIN: 5.4 g/dL — AB (ref 6.5–8.1)

## 2017-04-22 LAB — CBC WITH DIFFERENTIAL/PLATELET
BASOS ABS: 0 10*3/uL (ref 0.0–0.1)
BASOS PCT: 0 %
EOS ABS: 0 10*3/uL (ref 0.0–0.7)
EOS PCT: 0 %
HCT: 21.3 % — ABNORMAL LOW (ref 39.0–52.0)
Hemoglobin: 7.6 g/dL — ABNORMAL LOW (ref 13.0–17.0)
LYMPHS PCT: 11 %
Lymphs Abs: 0.4 10*3/uL — ABNORMAL LOW (ref 0.7–4.0)
MCH: 28.7 pg (ref 26.0–34.0)
MCHC: 35.7 g/dL (ref 30.0–36.0)
MCV: 80.4 fL (ref 78.0–100.0)
MONO ABS: 0.4 10*3/uL (ref 0.1–1.0)
Monocytes Relative: 11 %
Neutro Abs: 3.1 10*3/uL (ref 1.7–7.7)
Neutrophils Relative %: 78 %
PLATELETS: 161 10*3/uL (ref 150–400)
RBC: 2.65 MIL/uL — AB (ref 4.22–5.81)
RDW: 16.9 % — AB (ref 11.5–15.5)
WBC: 3.9 10*3/uL — AB (ref 4.0–10.5)

## 2017-04-22 LAB — ECHOCARDIOGRAM COMPLETE
HEIGHTINCHES: 76 in
Weight: 2480 oz

## 2017-04-22 LAB — HIV ANTIBODY (ROUTINE TESTING W REFLEX): HIV Screen 4th Generation wRfx: NONREACTIVE

## 2017-04-22 LAB — PREPARE RBC (CROSSMATCH)

## 2017-04-22 MED ORDER — SODIUM CHLORIDE 0.9 % IV SOLN
Freq: Once | INTRAVENOUS | Status: AC
  Administered 2017-04-22: 10:00:00 via INTRAVENOUS

## 2017-04-22 MED ORDER — HEPARIN SOD (PORK) LOCK FLUSH 100 UNIT/ML IV SOLN
500.0000 [IU] | INTRAVENOUS | Status: AC | PRN
  Administered 2017-04-22: 500 [IU]

## 2017-04-22 NOTE — Progress Notes (Signed)
  Echocardiogram 2D Echocardiogram has been performed.  Tresa Res 04/22/2017, 9:58 AM

## 2017-04-22 NOTE — Discharge Summary (Signed)
Physician Discharge Summary  Kurt Fisher WLN:[id number] DOB: September 11, 1960 DOA: 05-15-2017  PCP: [name], PA-C  Admit date: 2017-05-15 Discharge date: 04/22/2017  Recommendations for Outpatient Follow-up:  1. Follow up with PCP in 1 weeks to make sure hgb is stable   Discharge Diagnoses:  Principal Problem:   Syncope Active Problems:   Anxiety   GERD (gastroesophageal reflux disease)   Guaiac positive stools   Gastroesophageal reflux disease without esophagitis   Malignant neoplasm of lower third of esophagus (HCC)   Iron deficiency anemia   Bipolar 1 disorder (HCC)   Acute blood loss anemia    Discharge Condition: stable; pt does not want to stay in hospital. He is agreeable to transfusion prior to d/c but does not want to stay after that.  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 Pecola Leisure last chemo was on 04/11/17 and he required IVF on 04/14/17.   Per G-tube he tolerates 2 cans osmolite 1.5 per feeding 3 times per day with 120 cc free water with each feeding. Patient   brought in by ex-wife 05/16/2023 as the patient had passed out. Patient was in the doorway of the bathroom when she found him. Patient was found to be incontinent of urine, unresponsive for less than a minute. Patient was unable to get up and walk back to the bed. Patient felt lightheaded and dizzy , EMS was called and patient was brought in. G-tube has had darker than normal drainage. Patient also unable to aspirate anything via G-tube this morning. Patient denied any melena, hematochezia."  Hgb was 6.6 so has received 2 U PRBC transfusion, After 1 U PRBC hgb was 7.6.    Hospital Course:   Principal Problem:   Syncope -  [place] due to symptomatic anemia - Feels better - Wanted to go home today - ECHO is pending at this time  Active Problems:   Symptomatic anemia - Hgb with appropriate rise since 1 U PRBC - Transfuse 1 more unit prior to discharge - Pt does not want any evaluation at this time, he feels better      Malignant neoplasm of lower third of esophagus ([place]) - Per oncology     Anxiety and depression - Continue home meds     Signed:  Leisa Lenz, MD  [facility] 04/22/2017, 11:09 AM  Pager #: [phone]  Time spent in minutes: more than 30 minutes  Procedures:  2 U PRBC transfusion   Consultations:  None   Discharge Exam: Vitals:   04/22/17 1024 04/22/17 1102  BP: 106/73 114/78  Pulse: (!) 108 (!) 105  Resp: 18 18  Temp: 98.5 F (36.9 C) 99.1 F (37.3 C)  SpO2: 100% 100%   Vitals:   05/15/2017 2356 04/22/17 0436 04/22/17 1024 04/22/17 1102  BP: 122/77 129/78 106/73 114/78  Pulse: (!) 121 (!) 129 (!) 108 (!) 105  Resp: 14 18 18 18   Temp: 99.1 F (37.3 C) 98.2 F (36.8 C) 98.5 F (36.9 C) 99.1 F (37.3 C)  TempSrc: Oral Oral Oral Oral  SpO2: 100% 100% 100% 100%  Weight:      Height:        General: Pt is alert, follows commands appropriately, not in acute distress Cardiovascular: Regular rate and  rhythm, S1/S2 +, no murmurs Respiratory: Clear to auscultation bilaterally, no wheezing, no crackles, no rhonchi Abdominal: Soft, non tender, non distended, bowel sounds +, no guarding Extremities: no edema, no cyanosis, pulses palpable bilaterally DP and PT Neuro: Grossly nonfocal  Discharge Instructions  Discharge Instructions    Call MD for:  persistant nausea and vomiting    Complete by:  As directed    Call MD for:  redness, tenderness, or signs of infection (pain, swelling, redness, odor or green/yellow discharge around incision site)    Complete by:  As directed    Call MD for:  severe uncontrolled pain    Complete by:  As directed    Diet - low  sodium heart healthy    Complete by:  As directed    Increase activity slowly    Complete by:  As directed      Allergies as of 04/22/2017      Reactions   Taxol [paclitaxel] Other (See Comments)   Flushing, lightheaded and nausea      Medication List    TAKE these medications   bismuth subsalicylate 315 VV/61YW suspension Commonly known as:  PEPTO BISMOL Take 30 mLs by mouth every 6 (six) hours as needed for indigestion or diarrhea or loose stools.   dexamethasone 4 MG tablet Commonly known as:  DECADRON Take 1 tablet (4 mg total) by mouth 2 (two) times daily with a meal.   emollient cream Commonly known as:  BIAFINE Apply 1 application topically 2 (two) times daily. Apply to chest area after rad tx daily and prn,nothing 4 hours prior to rad txs,   ENSURE PLUS Liqd Take 237 mLs by mouth 3 (three) times daily between meals.   feeding supplement (OSMOLITE 1.5 CAL) Liqd Give 1 1/2 cans of tube feeding via feeding tube 4 times per day.  Flush tube with 20ml of water before and after feeding.   Start with 1/2 can of tube feeding 4 times per day and increase by 1/2 can daily until goal rate of 6 cans per day reached. Send bolus tube feeding supplies.   HYDROcodone-acetaminophen 7.5-325 mg/15 ml solution Commonly known as:  HYCET Take 15 mLs by mouth every 6 (six) hours as needed for moderate pain.   lidocaine-prilocaine cream Commonly known as:  EMLA Apply 1 application topically as needed. Apply to portacath  1 1/2 hours to 2 hours prior to procedures as needed.   ondansetron 4 MG tablet Commonly known as:  ZOFRAN TAKE 2 TABLETS BY MOUTH EVERY 8 HOURS AS NEEDED FOR NAUSEA OR VOMITING   pantoprazole 40 MG tablet Commonly known as:  PROTONIX Take 1 tablet (40 mg total) by mouth 2 (two) times daily before a meal.   PARoxetine 40 MG tablet Commonly known as:  PAXIL Take 40 mg by mouth every evening.   prochlorperazine 10 MG tablet Commonly known as:  COMPAZINE TAKE 1  TABLET BY MOUTH EVERY 6 HOURS AS NEEDED FOR NAUSEA OR VOMITING   QUEtiapine 300 MG tablet Commonly known as:  SEROQUEL Take 600 mg by mouth at bedtime.            Discharge Care Instructions        Start     Ordered   04/22/17 0000  Increase activity slowly     04/22/17 1108   04/22/17 0000  Diet - low sodium heart healthy     04/22/17 1108   04/22/17 0000  Call MD for:  persistant nausea and vomiting  04/22/17 1108   04/22/17 0000  Call MD for:  severe uncontrolled pain     04/22/17 1108   04/22/17 0000  Call MD for:  redness, tenderness, or signs of infection (pain, swelling, redness, odor or green/yellow discharge around incision site)     04/22/17 1108     Follow-up Information    [name], PA-C. Schedule an appointment as soon as possible for a visit in 1 week(s).   Specialty:  Physician Assistant Contact information: [address] [place] [address] [phone]            The results of significant diagnostics from this hospitalization (including imaging, microbiology, ancillary and laboratory) are listed below for reference.    Significant Diagnostic Studies: No results found.  Microbiology: No results found for this or any previous visit (from the past 240 hour(s)).   Labs: Basic Metabolic Panel:  Recent Labs Lab 04/19/17 1224 04/21/17 1207 04/22/17 0506  NA 131* 128* 134*  K 3.8 4.4 3.7  CL  --  95* 101  CO2 29 24 25   GLUCOSE 162* 113* 105*  BUN 13.6 25* 20  CREATININE 0.7 0.70 0.60*  CALCIUM 8.8 8.2* 8.3*   Liver Function Tests:  Recent Labs Lab 04/19/17 1224 04/21/17 1207 04/22/17 0506  AST 11 19 17   ALT 10 17 16*  ALKPHOS 70 65 64  BILITOT 0.25 0.2* 0.4  PROT 6.2* 5.4* 5.4*  ALBUMIN 2.5* 2.3* 2.3*   No results for input(s): LIPASE, AMYLASE in the last 168 hours. No results for input(s): AMMONIA in the last 168 hours. CBC:  Recent Labs Lab 04/19/17 1224 04/21/17 1207 04/22/17 0506  WBC 2.6* 3.7*  3.9*  NEUTROABS 2.0  --  3.1  HGB 8.9* 6.6* 7.6*  HCT 26.2* 18.9* 21.3*  MCV 86.3 82.9 80.4  PLT 165 171 161   Cardiac Enzymes: No results for input(s): CKTOTAL, CKMB, CKMBINDEX, TROPONINI in the last 168 hours. BNP: BNP (last 3 results) No results for input(s): BNP in the last 8760 hours.  ProBNP (last 3 results) No results for input(s): PROBNP in the last 8760 hours.  CBG:  Recent Labs Lab 04/21/17 1156  GLUCAP 118*

## 2017-04-22 NOTE — Telephone Encounter (Signed)
[place] room 1441,spoke with the patient directly, asked how he was feeling , and was sorry to hear what happened, he stated ok, asked if he was up to getting radiation today at his mnormal time at 2:20pm?"I don't know how i'll feel, he is getting another unit of prbc this am", will call him after noon and recheck with him 9:17 AM

## 2017-04-22 NOTE — Discharge Instructions (Signed)
Blood Transfusion, Adult, Care After This sheet gives you information about how to care for yourself after your procedure. Your health care provider may also give you more specific instructions. If you have problems or questions, contact your health care provider. What can I expect after the procedure? After your procedure, it is common to have:  Bruising and soreness where the IV tube was inserted.  Headache.  Follow these instructions at home:  Take over-the-counter and prescription medicines only as told by your health care provider.  Return to your normal activities as told by your health care provider.  Follow instructions from your health care provider about how to take care of your IV insertion site. Make sure you: ? Wash your hands with soap and water before you change your bandage (dressing). If soap and water are not available, use hand sanitizer. ? Change your dressing as told by your health care provider.  Check your IV insertion site every day for signs of infection. Check for: ? More redness, swelling, or pain. ? More fluid or blood. ? Warmth. ? Pus or a bad smell. Contact a health care provider if:  You have more redness, swelling, or pain around the IV insertion site.  You have more fluid or blood coming from the IV insertion site.  Your IV insertion site feels warm to the touch.  You have pus or a bad smell coming from the IV insertion site.  Your urine turns pink, red, or brown.  You feel weak after doing your normal activities. Get help right away if:  You have signs of a serious allergic or immune system reaction, including: ? Itchiness. ? Hives. ? Trouble breathing. ? Anxiety. ? Chest or lower back pain. ? Fever, flushing, and chills. ? Rapid pulse. ? Rash. ? Diarrhea. ? Vomiting. ? Dark urine. ? Serious headache. ? Dizziness. ? Stiff neck. ? Yellow coloration of the face or the white parts of the eyes (jaundice). This information is not  intended to replace advice given to you by your health care provider. Make sure you discuss any questions you have with your health care provider. Document Released: 08/02/2014 Document Revised: 03/10/2016 Document Reviewed: 01/26/2016 Elsevier Interactive Patient Education  2018 [employer]  

## 2017-04-23 LAB — TYPE AND SCREEN
ABO/RH(D): O NEG
ANTIBODY SCREEN: NEGATIVE
UNIT DIVISION: 0
Unit division: 0
Unit division: 0

## 2017-04-23 LAB — BPAM RBC
BLOOD PRODUCT EXPIRATION DATE: 201810162359
Blood Product Expiration Date: 201810162359
Blood Product Expiration Date: 201810172359
ISSUE DATE / TIME: 201809271614
ISSUE DATE / TIME: 201809272018
ISSUE DATE / TIME: 201809281031
UNIT TYPE AND RH: 9500
UNIT TYPE AND RH: 9500
Unit Type and Rh: 9500

## 2017-04-24 NOTE — Progress Notes (Addendum)
[place]  [phone] [phone]) [phone]  Clinic Follow up Note   Patient Care Team: Rennis Golden as PCP - General (Physician Assistant) 04/25/2017  SUMMARY OF ONCOLOGIC HISTORY: Oncology History   Stage IVA (cT4BcN2cM0) adenocarcinoma of esophagus at 37-41 cm from incisors with intrinsic stenosis and invasion into the middle of mediastinum, encasing and invading the left mainstem bronchus and nodal metastases clinically to the mediastinum, bilateral hilum, and gastrohepatic ligament.     Malignant neoplasm of lower third of esophagus ([place])   01/29/2017 Imaging    CT abd/pelvis: 1. No acute abnormality or change from prior exam. 2. Heterogeneous nodular prostate gland with hyperdense nodularity extending into the bladder base. 3. Moderate hiatal hernia with distal esophageal wall thickening. Wall thickening likely secondary to esophagitis/reflux, cannot exclude esophageal malignancy in the setting of decreased appetite and weight loss. Consider endoscopy.      02/09/2017 Pathology Results    EGD/Colonoscopy by Dr. Laural Golden- One moderate (circumferential scarring or stenosis; an endoscope may pass) malignant-appearing, intrinsic stenosis was found 37 to 41 cm from the incisors. This measured 8 mm (inner diameter) x 4 cm (in length) and was traversed.at the distal and it was polypoidal or masslike. Biopsies were taken with a cold forceps for histology.      02/11/2017 Pathology Results    INVASIVE POORLY DIFFERENTIATED ADENOCARCINOMA.      02/15/2017 Imaging    CT Chest W Contrast 02/15/17 IMPRESSION: 1. There is a large mass involving the mid and distal esophagus. The mass invades the middle mediastinum and encases the left mainstem bronchus. 2. Evidence of mediastinal and bilateral hilar lymph node metastasis. Enlarged gastrohepatic ligament node is also noted. 3. Hiatal hernia.      02/16/2017 Cancer Staging    Cancer Staging Esophageal  cancer [facility]) Staging form: Esophagus - Adenocarcinoma, AJCC 8th Edition - Clinical stage from 02/16/2017: Stage IVA (cT4b, cN2, cM0) - Signed by Baird Cancer, PA-C on 02/16/2017       03/07/2017 Procedure    Placement of G-tube & port-a-cath by IR.       03/22/2017 -  Chemotherapy    Weekly Carbo Taxol with concurrent radiation       03/22/2017 -  Radiation Therapy    Concurrent chemoradiation with Dr. Lisbeth Renshaw       04/21/2017 - 04/22/2017 Hospital Admission    Admit date: 04/21/17 Admission diagnosis: syncope and symptomatic anemia Additional comments: received 2U PRBC while admitted       INTERIM HISTORY:  Kurt Fisher is here for a follow up of Esophogeal cancer. He presents to the clinic today with his his ex-wife.   He started noticing trouble swallowing in 08/2016. He then started to throw up regularly and not be able to eat certain things at the end of 11/2016. He say GI doctor in [place] and gave Protonix. He also had a hiatal hernia. He had an EGD in 02/09/17 by dr. Laural Golden.  Before feeding tube he lost about 50 pounds. He is now gaining weight since feeding tube placement. He is doing 2.5 cans a day at the moment and will increase to 6 cans a day. He is starting to feel better overall. He eats liquid like jelly and water and soup and be abel to keep it down. He lives by himself. His energy is better. His pain form the tube placement is still there when he moves certain ways.   He has 2 children 20 and 18 years  old. He is on disability due to his mental health. He has a psychiatrist. He is taking Paxil and Seroquel.   His father committed suicide in 2015 and he had a mental reaction to that where he went to the hospital. He feels he is able to handle chemo diagnosis right now. His father had cancer and his mother had lung cancer at 23. His paternal uncle had brain cancer, his paternal grandmother had lung cancer. He smokes marijuana  as needed. He takes hydrocodone every 6  hours for his pain G tube pain.   CURRENT THERAPY: Concurrent chemoradiation with weekly carbo taxol started 03/22/17, taxol changed to Abraxane due to significant reaction to Taxol, starting 04/05/17, chemo held after 04/11/17 due to poor tolerance and pt's request   INTERVAL HISTORY:  Kurt Fisher is here for a follow up and chemoradiation. Week 4 treatment was held last week due to poor performance status and dehydration. He presented to the ED on 04/21/2017 for syncopal event at home and anemia, Hgb 6.6. He was admitted and discharged the next day after 2 units RBCs and IV hydration. He reports feeling better after transfusion, but remains constantly nauseated. He uses Zofran, Compazine, and Protonix around the clock. He was previously prescribed twice a day Decadron but does not know if he takes it. He tolerates no more than 6 cans of G-tube feedings per day, divided into 3 meals with 2 cans at each meal. He tolerates free water flushes and by mouth water intake. He has intermittent chills and hot flashes but denies fever. His abdominal pain is well controlled with PRN hycet. He denies lightheadedness or dizziness today. However, he is requesting to stop chemotherapy today due to poor tolerance and side effects, primarily nausea.  REVIEW OF SYSTEMS:   Constitutional: Denies fevers, (+) alternating chills and hot flashes (+) weight loss (+) fatigue, improved after RBC transfusion  Eyes: Denies blurriness of vision Ears, nose, mouth, throat, and face: Denies mucositis or sore throat Respiratory: Denies cough, dyspnea or wheezes Cardiovascular: Denies palpitation, chest discomfort or lower extremity swelling Gastrointestinal:  Denies heartburn or change in bowel habits (+) nausea, improves with anti-emetics but does not resolve (+) reflux and belching with advancing G-tube feedings Skin: Denies abnormal skin rashes Lymphatics: Denies new lymphadenopathy or easy bruising Neurological:Denies numbness,  tingling or new weaknesses. Denies lightheadedness or dizziness Behavioral/Psych: Mood is stable, no new changes (+) Bipolar, depression, anxiety All other systems were reviewed with the patient and are negative.  MEDICAL HISTORY:  Past Medical History:  Diagnosis Date  . Anxiety   . Bipolar 1 disorder ([place])   . Depression   . Esophageal cancer ([place]) 02/16/2017  . GERD (gastroesophageal reflux disease)    SURGICAL HISTORY: Past Surgical History:  Procedure Laterality Date  . BIOPSY  02/09/2017   Procedure: BIOPSY;  Surgeon: Rogene Houston, MD;  [facility];  Service: Endoscopy;;  gastric/GE Junction/distal esophagus bx   . COLONOSCOPY N/A 02/09/2017   Procedure: COLONOSCOPY;  Surgeon: Rogene Houston, MD;  Location: [facility];  Service: Endoscopy;  Laterality: N/A;  . ESOPHAGOGASTRODUODENOSCOPY N/A 02/09/2017   Procedure: ESOPHAGOGASTRODUODENOSCOPY (EGD);  Surgeon: Rogene Houston, MD;  Location: [facility];  Service: Endoscopy;  Laterality: N/A;  2:40-moved up to 1255 per Lelon Frohlich  . IR FLUORO GUIDE PORT INSERTION RIGHT  03/07/2017  . IR GASTROSTOMY TUBE MOD SED  03/07/2017  . IR PATIENT EVAL TECH 0-60 MINS  03/18/2017  . IR US GUIDE  VASC ACCESS RIGHT  03/07/2017   I have reviewed the social history and family history with the patient and they are unchanged from previous note.  ALLERGIES:  is allergic to taxol [paclitaxel].  MEDICATIONS:  Current Outpatient Prescriptions  Medication Sig Dispense Refill  . bismuth subsalicylate (PEPTO BISMOL) 262 MG/15ML suspension Take 30 mLs by mouth every 6 (six) hours as needed for indigestion or diarrhea or loose stools.    Marland Kitchen emollient (BIAFINE) cream Apply 1 application topically 2 (two) times daily. Apply to chest area after rad tx daily and prn,nothing 4 hours prior to rad txs,    . ENSURE PLUS (ENSURE PLUS) LIQD Take 237 mLs by mouth 3 (three) times daily between meals.    Marland Kitchen HYDROcodone-acetaminophen (HYCET) 7.5-325 mg/15 ml  solution Take 15 mLs by mouth every 6 (six) hours as needed for moderate pain. 473 mL 0  . lidocaine-prilocaine (EMLA) cream Apply 1 application topically as needed. Apply to portacath  1 1/2 hours to 2 hours prior to procedures as needed. 30 g 1  . ondansetron (ZOFRAN) 4 MG tablet TAKE 2 TABLETS BY MOUTH EVERY 8 HOURS AS NEEDED FOR NAUSEA OR VOMITING 30 tablet 2  . pantoprazole (PROTONIX) 40 MG tablet Take 1 tablet (40 mg total) by mouth 2 (two) times daily before a meal. 60 tablet 3  . PARoxetine (PAXIL) 40 MG tablet Take 40 mg by mouth every evening.     . prochlorperazine (COMPAZINE) 10 MG tablet TAKE 1 TABLET BY MOUTH EVERY 6 HOURS AS NEEDED FOR NAUSEA OR VOMITING 30 tablet 2  . QUEtiapine (SEROQUEL) 300 MG tablet Take 600 mg by mouth at bedtime.     Marland Kitchen dexamethasone (DECADRON) 4 MG tablet Take 1 tablet (4 mg total) by mouth 2 (two) times daily with a meal. 20 tablet 0  . Nutritional Supplements (FEEDING SUPPLEMENT, OSMOLITE 1.5 CAL,) LIQD Give 1 1/2 cans of tube feeding via feeding tube 4 times per day.  Flush tube with 20m of water before and after feeding.   Start with 1/2 can of tube feeding 4 times per day and increase by 1/2 can daily until goal rate of 6 cans per day reached. Send bolus tube feeding supplies. (Patient not taking: Reported on 04/25/2017) 1422 mL 0   No current facility-administered medications for this visit.    Facility-Administered Medications Ordered in Other Visits  Medication Dose Route Frequency Provider Last Rate Last Dose  . sodium chloride flush (NS) 0.9 % injection 10 mL  10 mL Intravenous PRN FTruitt Merle MD   10 mL at 04/05/17 1343   PHYSICAL EXAMINATION:  ECOG PERFORMANCE STATUS: 3  Vitals:   04/25/17 0913  BP: 103/63  Pulse: (!) 110  Resp: 20  Temp: 98.3 F (36.8 C)  SpO2: 100%   Filed Weights   04/25/17 0913  Weight: 153 lb 11.2 oz (69.7 kg)     GENERAL:alert, no distress and comfortable SKIN: skin color, texture, turgor are normal, no  rashes or significant lesions EYES: normal, Conjunctiva are pink and non-injected, sclera clear OROPHARYNX:no exudate, no erythema and lips, buccal mucosa, and tongue normal  NECK: supple, thyroid normal size, non-tender, without nodularity LYMPH:  no palpable Cervical or supraclavicular lymphadenopathy  LUNGS: clear to auscultation bilaterally with normal breathing effort HEART: regular rate & rhythm, S1 and S2 present, no murmurs and no lower extremity edema ABDOMEN:abdomen soft, flat, non-tender and normal bowel sounds, no palpable hepatomegaly or masses (+) G tube  Musculoskeletal:no cyanosis of digits and  no clubbing  NEURO: alert & oriented x 3 with fluent speech, no focal motor/sensory deficits  LABORATORY DATA:  I have reviewed the data as listed CBC Latest Ref Rng & Units 04/25/2017 04/22/2017 04/21/2017  WBC 4.0 - 10.3 10e3/uL 5.3 3.9(L) 3.7(L)  Hemoglobin 13.0 - 17.1 g/dL 9.8(L) 7.6(L) 6.6(LL)  Hematocrit 38.4 - 49.9 % 28.7(L) 21.3(L) 18.9(L)  Platelets 140 - 400 10e3/uL 305 161 171    CMP Latest Ref Rng & Units 04/25/2017 04/22/2017 04/21/2017  Glucose 70 - 140 mg/dl 179(H) 105(H) 113(H)  BUN 7.0 - 26.0 mg/dL 10.6 20 25(H)  Creatinine 0.7 - 1.3 mg/dL 0.7 0.60(L) 0.70  Sodium 136 - 145 mEq/L 134(L) 134(L) 128(L)  Potassium 3.5 - 5.1 mEq/L 3.7 3.7 4.4  Chloride 101 - 111 mmol/L - 101 95(L)  CO2 22 - 29 mEq/L _0 Calcium 8.4 - 10.4 mg/dL 9.0 8.3(L) 8.2(L)  Total Protein 6.4 - 8.3 g/dL 6.2(L) 5.4(L) 5.4(L)  Total Bilirubin 0.20 - 1.20 mg/dL 0.27 0.4 0.2(L)  Alkaline Phos 40 - 150 U/L 79 64 65  AST 5 - 34 U/L _1 ALT 0 - 55 U/L 20 16(L) 17   PATHOLOGY   EGD by Dr. Laural Golden 02/09/17 Diagnosis Esophagogastric junction, biopsy - INVASIVE POORLY DIFFERENTIATED ADENOCARCINOMA. Microscopic Comment Dr. Gari Crown has reviewed the case. The case was called to Dr. Laural Golden on 02/11/2017. ADDITIONAL INFORMATION: By immunohistochemistry, the tumor cells are Negative for Her2  (1+).  PROCEDURES  EGD by Dr. Bernadene Person 02/09/2017 - Normal proximal esophagus and mid esophagus. - 4 cm long distal esophageal stricture secondary to infiltrating lesion felt to be malignant with polypoidal complement at GE junction. Biopsied. - Z-line irregular, 41 cm from the incisors. - 2 cm hiatal hernia. - A small amount of food (residue) in the stomach. - Normal duodenal bulb and second portion of the duodenum  Colonoscopy 02/09/17 IMPRESSION - Preparation of the colon was inadequate. - The rectum, recto-sigmoid colon, sigmoid colon, descending colon, splenic flexure, transverse colon and hepatic flexure are normal. - Examination of ascending colon was very limited because of presence of larormed stool. - External hemorrhoids. - No specimens collected.  Echocardiogram 04/22/17 Study Conclusions  - Left ventricle: The cavity size was normal. Wall thickness was   normal. Systolic function was normal. The estimated ejection   fraction was in the range of 50% to 55%. Due to tachycardia,   there was fusion of early and atrial contributions to ventricular   filling. The study is not technically sufficient to allow   evaluation of LV diastolic function.   RADIOGRAPHIC STUDIES: I have personally reviewed the radiological images as listed and agreed with the findings in the report. No results found.   CT CHEST 02/14/17: IMPRESSION: 1. There is a large mass involving the mid and distal esophagus. The mass invades the middle mediastinum and encases the left mainstem bronchus. 2. Evidence of mediastinal and bilateral hilar lymph node metastasis. Enlarged gastrohepatic ligament node is also noted. 3. Hiatal hernia.  CT A/P 01/29/17: IMPRESSION: 1. No acute abnormality or change from prior exam. 2. Heterogeneous nodular prostate gland with hyperdense nodularity extending into the bladder base. 3. Moderate hiatal hernia with distal esophageal wall thickening. Wall thickening  likely secondary to esophagitis/reflux, cannot exclude esophageal malignancy in the setting of decreased appetite and weight loss. Consider endoscopy.  ASSESSMENT & PLAN:  Kurt Fisher  is a 56 y.o. caucasian male with a history of anxiety, GERD, depression, and Bipolar  1 disorder.   1. Adenocarcinoma of the Esophagus, Stage IVA ([id number]) -Primary oncologist has previously reviewed the patients medical records, scans, and pathology with the patient in detail.  -He understands the treatment plan and goal of therapy, which is curative at this point. If he does not have a good response to chemoradiation and is not a surgical candidate, the goal of treatment is palliative. -He began chemoradiation with weekly taxol and carboplatin on 03/22/17.  -Chemo was held on 03/30/17 week 2 due to hypotension, dizziness, and fatigue; he received feraheme and IVF on 04/01/17 -Therapy was changed from taxol to abraxane on week 3 due to taxol infusion reaction with flushing, nausea, and lightheadedness, he received solumedrol 60 mg IV and flushing subsided but did not receive the full dose of taxol. -He has tolerated chemotherapy poorly overall, requiring treatment interruption on weeks 2 and 4 due to poor performance status and dehydration -He is requesting to stop chemotherapy altogether today. I informed him that his best chance for cure at this point is to receive chemotherapy and radiation together to optimize response prior to surgery, but we of course will honor his wishes to discontinue. He will continue radiation therapy, we can consider more chemo in the furture  -Dr. Burr Medico previously discussed the chemotherapy options. If he is not a candidate for surgery, MD recommended him to consider chemo after radiation if he can tolerate  -Will repeat scan after completion of chemoradiation to further evaluate his following treatment.  -IVF later this week  -F/u next week  2. Weight loss/Nutrician  -Has G Tube  placement in 03/07/17 -recently refilled Hydrocodone once for pain while healing but he will take only as needed for sever pain -Previously encouraged to advance feedings to 7-8 cans per day plus free water flush, he cannot tolerate more than 6 -he continues to have steady weight loss -He wants to change from osmolite 1.5 to ensure, will see dietician today -We reviewed medication, he is to restart taking decadron 4 mg BID with meals for appetite and nausea, he will call if he has a problem refilling Rx at pharmacy; I reviewed that this may increase BG - we will monitor   3. Iron deficient anemia -He has moderate anemia, likely related to tumor bleeding.  -Received Feraheme on 03/25/17, 03/25/2017 and 03/30/17 -s/p 2 units pRBCs on 9/27 for Hgb 6.6, recovered to 9.8 today  4. Bipolar -Overall stable, continue medication.  5. Borderline hypotension -He is not on blood pressure medication. This is likely related to chemotherapy and irradiation, and insufficient oral intake -He receives 1 L NS over 2 hours PRN for hypotension -rad/onc monitors VS daily before treatment -His BP is stable, no IVF today, will schedule infusion later this week Thursday or friday  PLAN:  -discontinue chemotherapy due to poor tolerance and the patient's request -IVF later this week, Thursday or Friday -restart decadron 4 mg daily with meals for appetite, nausea -continue radiation therapy -f/u with Ernestene Kiel, dietician today -continue Gtube feedings and free water, appreciate insight from dietician -lab, f/u 05/03/17, holding chemo infusion apt for IVF if needed   Orders Placed This Encounter  Procedures  . SCHEDULING COMMUNICATION INJECTION    Schedule port flush appointment   All questions were answered. The patient knows to call the clinic with any problems, questions or concerns. No barriers to learning was detected.  I spent 20 minutes counseling the patient face to face. The total time spent in the  appointment was 25  minutes and more than 50% was on counseling and review of test results  Cira Rue, AGNP-C 04/25/2017  I have seen the patient, examined him. I agree with the assessment and and plan and have edited the notes.   Truitt Merle  04/25/2017

## 2017-04-25 ENCOUNTER — Other Ambulatory Visit: Payer: No Typology Code available for payment source

## 2017-04-25 ENCOUNTER — Ambulatory Visit: Payer: Self-pay

## 2017-04-25 ENCOUNTER — Ambulatory Visit
Admission: RE | Admit: 2017-04-25 | Discharge: 2017-04-25 | Disposition: A | Discharge status: Home / Self Care | Payer: Medicare Other | Source: Ambulatory Visit | Attending: Radiation Oncology | Admitting: Radiation Oncology

## 2017-04-25 ENCOUNTER — Encounter: Payer: No Typology Code available for payment source | Admitting: Nutrition

## 2017-04-25 ENCOUNTER — Ambulatory Visit: Payer: No Typology Code available for payment source | Admitting: Hematology

## 2017-04-25 ENCOUNTER — Other Ambulatory Visit (HOSPITAL_BASED_OUTPATIENT_CLINIC_OR_DEPARTMENT_OTHER): Payer: Self-pay

## 2017-04-25 ENCOUNTER — Ambulatory Visit: Payer: No Typology Code available for payment source

## 2017-04-25 ENCOUNTER — Ambulatory Visit: Payer: Self-pay | Admitting: Nutrition

## 2017-04-25 ENCOUNTER — Ambulatory Visit (HOSPITAL_BASED_OUTPATIENT_CLINIC_OR_DEPARTMENT_OTHER): Payer: Self-pay

## 2017-04-25 ENCOUNTER — Ambulatory Visit (HOSPITAL_BASED_OUTPATIENT_CLINIC_OR_DEPARTMENT_OTHER): Payer: Self-pay | Admitting: Hematology

## 2017-04-25 ENCOUNTER — Encounter: Payer: Self-pay | Admitting: Hematology

## 2017-04-25 VITALS — BP 103/63 | HR 110 | Temp 98.3°F | Resp 20 | Ht 76.0 in | Wt 153.7 lb

## 2017-04-25 DIAGNOSIS — C154 Malignant neoplasm of middle third of esophagus: Secondary | ICD-10-CM

## 2017-04-25 DIAGNOSIS — D509 Iron deficiency anemia, unspecified: Secondary | ICD-10-CM

## 2017-04-25 DIAGNOSIS — C155 Malignant neoplasm of lower third of esophagus: Secondary | ICD-10-CM

## 2017-04-25 DIAGNOSIS — Z452 Encounter for adjustment and management of vascular access device: Secondary | ICD-10-CM

## 2017-04-25 DIAGNOSIS — F319 Bipolar disorder, unspecified: Secondary | ICD-10-CM

## 2017-04-25 DIAGNOSIS — R195 Other fecal abnormalities: Secondary | ICD-10-CM

## 2017-04-25 LAB — CBC WITH DIFFERENTIAL/PLATELET
BASO%: 0.2 % (ref 0.0–2.0)
BASOS ABS: 0 10*3/uL (ref 0.0–0.1)
EOS%: 0.2 % (ref 0.0–7.0)
Eosinophils Absolute: 0 10*3/uL (ref 0.0–0.5)
HEMATOCRIT: 28.7 % — AB (ref 38.4–49.9)
HGB: 9.8 g/dL — ABNORMAL LOW (ref 13.0–17.1)
LYMPH#: 0.1 10*3/uL — AB (ref 0.9–3.3)
LYMPH%: 2.4 % — AB (ref 14.0–49.0)
MCH: 29.8 pg (ref 27.2–33.4)
MCHC: 34.1 g/dL (ref 32.0–36.0)
MCV: 87.4 fL (ref 79.3–98.0)
MONO#: 0.4 10*3/uL (ref 0.1–0.9)
MONO%: 7.6 % (ref 0.0–14.0)
NEUT#: 4.8 10*3/uL (ref 1.5–6.5)
NEUT%: 89.6 % — AB (ref 39.0–75.0)
PLATELETS: 305 10*3/uL (ref 140–400)
RBC: 3.28 10*6/uL — AB (ref 4.20–5.82)
RDW: 17.3 % — ABNORMAL HIGH (ref 11.0–14.6)
WBC: 5.3 10*3/uL (ref 4.0–10.3)

## 2017-04-25 LAB — COMPREHENSIVE METABOLIC PANEL
ALT: 20 U/L (ref 0–55)
ANION GAP: 7 meq/L (ref 3–11)
AST: 15 U/L (ref 5–34)
Albumin: 2.3 g/dL — ABNORMAL LOW (ref 3.5–5.0)
Alkaline Phosphatase: 79 U/L (ref 40–150)
BUN: 10.6 mg/dL (ref 7.0–26.0)
CALCIUM: 9 mg/dL (ref 8.4–10.4)
CHLORIDE: 101 meq/L (ref 98–109)
CO2: 27 meq/L (ref 22–29)
Creatinine: 0.7 mg/dL (ref 0.7–1.3)
EGFR: >90 mL/min/{1.73_m2} (ref >90)
Glucose: 179 mg/dl — ABNORMAL HIGH (ref 70–140)
POTASSIUM: 3.7 meq/L (ref 3.5–5.1)
Sodium: 134 mEq/L — ABNORMAL LOW (ref 136–145)
Total Bilirubin: 0.27 mg/dL (ref 0.20–1.20)
Total Protein: 6.2 g/dL — ABNORMAL LOW (ref 6.4–8.3)

## 2017-04-25 MED ORDER — SODIUM CHLORIDE 0.9% FLUSH
10.0000 mL | INTRAVENOUS | Status: DC | PRN
  Administered 2017-04-25: 10 mL via INTRAVENOUS
  Filled 2017-04-25: qty 10

## 2017-04-25 MED ORDER — HEPARIN SOD (PORK) LOCK FLUSH 100 UNIT/ML IV SOLN
500.0000 [IU] | Freq: Once | INTRAVENOUS | Status: AC | PRN
  Administered 2017-04-25: 500 [IU] via INTRAVENOUS
  Filled 2017-04-25: qty 5

## 2017-04-25 MED ORDER — ALTEPLASE 2 MG IJ SOLR
2.0000 mg | Freq: Once | INTRAMUSCULAR | Status: DC | PRN
  Filled 2017-04-25: qty 2

## 2017-04-25 MED ORDER — HEPARIN SOD (PORK) LOCK FLUSH 100 UNIT/ML IV SOLN
250.0000 [IU] | Freq: Once | INTRAVENOUS | Status: DC | PRN
  Filled 2017-04-25: qty 5

## 2017-04-25 MED ORDER — SODIUM CHLORIDE 0.9% FLUSH
3.0000 mL | Freq: Once | INTRAVENOUS | Status: DC | PRN
  Filled 2017-04-25: qty 10

## 2017-04-25 NOTE — Patient Instructions (Signed)
Implanted Port Home Guide An implanted port is a type of central line that is placed under the skin. Central lines are used to provide IV access when treatment or nutrition needs to be given through a person's veins. Implanted ports are used for long-term IV access. An implanted port may be placed because:  You need IV medicine that would be irritating to the small veins in your hands or arms.  You need long-term IV medicines, such as antibiotics.  You need IV nutrition for a long period.  You need frequent blood draws for lab tests.  You need dialysis.  Implanted ports are usually placed in the chest area, but they can also be placed in the upper arm, the abdomen, or the leg. An implanted port has two main parts:  Reservoir. The reservoir is round and will appear as a small, raised area under your skin. The reservoir is the part where a needle is inserted to give medicines or draw blood.  Catheter. The catheter is a thin, flexible tube that extends from the reservoir. The catheter is placed into a large vein. Medicine that is inserted into the reservoir goes into the catheter and then into the vein.  How will I care for my incision site? Do not get the incision site wet. Bathe or shower as directed by your health care provider. How is my port accessed? Special steps must be taken to access the port:  Before the port is accessed, a numbing cream can be placed on the skin. This helps numb the skin over the port site.  Your health care provider uses a sterile technique to access the port. ? Your health care provider must put on a mask and sterile gloves. ? The skin over your port is cleaned carefully with an antiseptic and allowed to dry. ? The port is gently pinched between sterile gloves, and a needle is inserted into the port.  Only "non-coring" port needles should be used to access the port. Once the port is accessed, a blood return should be checked. This helps ensure that the port  is in the vein and is not clogged.  If your port needs to remain accessed for a constant infusion, a clear (transparent) bandage will be placed over the needle site. The bandage and needle will need to be changed every week, or as directed by your health care provider.  Keep the bandage covering the needle clean and dry. Do not get it wet. Follow your health care provider's instructions on how to take a shower or bath while the port is accessed.  If your port does not need to stay accessed, no bandage is needed over the port.  What is flushing? Flushing helps keep the port from getting clogged. Follow your health care provider's instructions on how and when to flush the port. Ports are usually flushed with saline solution or a medicine called heparin. The need for flushing will depend on how the port is used.  If the port is used for intermittent medicines or blood draws, the port will need to be flushed: ? After medicines have been given. ? After blood has been drawn. ? As part of routine maintenance.  If a constant infusion is running, the port may not need to be flushed.  How long will my port stay implanted? The port can stay in for as long as your health care provider thinks it is needed. When it is time for the port to come out, surgery will be   done to remove it. The procedure is similar to the one performed when the port was put in. When should I seek immediate medical care? When you have an implanted port, you should seek immediate medical care if:  You notice a bad smell coming from the incision site.  You have swelling, redness, or drainage at the incision site.  You have more swelling or pain at the port site or the surrounding area.  You have a fever that is not controlled with medicine.  This information is not intended to replace advice given to you by your health care provider. Make sure you discuss any questions you have with your health care provider. Document  Released: 07/12/2005 Document Revised: 12/18/2015 Document Reviewed: 03/19/2013 Elsevier Interactive Patient Education  2017 [employer]  

## 2017-04-25 NOTE — Progress Notes (Signed)
Nutrition follow-up completed with patient has been diagnosed with esophageal cancer. He is not having chemotherapy today. Weight decreased and documented as 153.7 pounds on October 1, decreased from 156 pounds September 24. Patient reports that he gives 2 bottles Osmolite 1.5, 3 times a day with 60 mL free water before and after bolus feeding. He is eating a little bit throughout the day. He reports indigestion after giving tube feeding.  Patient verbalizes desire to change to Ensure Plus. Noted weight loss of 2.3 pounds.  Revised estimated nutrition needs: 2300-2500 calories, 105-138 grams protein, 2.5 L fluid.  Nutrition diagnosis:  Inadequate oral intake and severe malnutrition.  Continue.  Intervention: I educated patient to change Osmolite 1.5-1-1/2 bottles 4 times a day at least 4 hours apart. Encouraged slow delivery of formula over 20 minutes minimum. Encouraged patient stay upright after feedings and oral intake for at least 45 minutes to one hour. Patient will begin Prilosec with M.D. Approval. Questions were answered.  Teach back method used.  Monitoring, evaluation, goals: Patient will tolerate 6 bottles of Osmolite 1.5. I hope to be able to increase tube feeding for weight gain if patient tolerates revised tube feeding regimen.  Next visit: Tuesday, October 9, during infusion.  **Disclaimer: This note was dictated with voice recognition software. Similar sounding words can inadvertently be transcribed and this note may contain transcription errors which may not have been corrected upon publication of note.**

## 2017-04-26 ENCOUNTER — Ambulatory Visit: Payer: Medicare Other

## 2017-04-26 ENCOUNTER — Telehealth: Payer: Self-pay | Admitting: Hematology

## 2017-04-26 ENCOUNTER — Ambulatory Visit
Admission: RE | Admit: 2017-04-26 | Discharge: 2017-04-26 | Disposition: A | Discharge status: Home / Self Care | Payer: Medicare Other | Source: Ambulatory Visit | Attending: Radiation Oncology | Admitting: Radiation Oncology

## 2017-04-26 NOTE — Telephone Encounter (Signed)
Called patient regarding 10/5

## 2017-04-27 ENCOUNTER — Ambulatory Visit: Admission: RE | Admit: 2017-04-27 | Payer: Medicare Other | Source: Ambulatory Visit

## 2017-04-27 ENCOUNTER — Ambulatory Visit: Payer: Medicare Other

## 2017-04-28 ENCOUNTER — Telehealth: Payer: Self-pay | Admitting: *Deleted

## 2017-04-28 ENCOUNTER — Ambulatory Visit: Payer: Medicare Other

## 2017-04-28 ENCOUNTER — Ambulatory Visit
Admission: RE | Admit: 2017-04-28 | Discharge: 2017-04-28 | Disposition: A | Discharge status: Home / Self Care | Payer: Medicare Other | Source: Ambulatory Visit | Attending: Radiation Oncology | Admitting: Radiation Oncology

## 2017-04-28 NOTE — Telephone Encounter (Signed)
Please refill, thanks   Truitt Merle MD

## 2017-04-28 NOTE — Telephone Encounter (Signed)
Received call from wife requesting refill of Hycet.  Stated pt has on 50 ml of Hycet left.  Pt will be here for xrt and IVF tomorrow 05/22/2017, and would like to pick up  Script. Pt's    Phone     [phone].

## 2017-04-29 ENCOUNTER — Other Ambulatory Visit: Payer: Self-pay

## 2017-04-29 ENCOUNTER — Ambulatory Visit: Payer: Medicare Other

## 2017-04-29 ENCOUNTER — Telehealth: Payer: Self-pay | Admitting: *Deleted

## 2017-04-29 ENCOUNTER — Ambulatory Visit: Payer: Self-pay

## 2017-04-29 ENCOUNTER — Encounter (HOSPITAL_COMMUNITY): Payer: Self-pay | Admitting: Emergency Medicine

## 2017-04-29 ENCOUNTER — Emergency Department (HOSPITAL_COMMUNITY): Payer: Medicare Other

## 2017-04-29 ENCOUNTER — Ambulatory Visit: Admission: RE | Admit: 2017-04-29 | Payer: Medicare Other | Source: Ambulatory Visit

## 2017-04-29 ENCOUNTER — Emergency Department (HOSPITAL_COMMUNITY)
Admission: EM | Admit: 2017-04-29 | Discharge: 2017-05-26 | Disposition: E | Payer: Medicare Other | Attending: Emergency Medicine | Admitting: Emergency Medicine

## 2017-04-29 DIAGNOSIS — J9601 Acute respiratory failure with hypoxia: Secondary | ICD-10-CM | POA: Insufficient documentation

## 2017-04-29 DIAGNOSIS — C155 Malignant neoplasm of lower third of esophagus: Secondary | ICD-10-CM | POA: Insufficient documentation

## 2017-04-29 DIAGNOSIS — Z79899 Other long term (current) drug therapy: Secondary | ICD-10-CM | POA: Insufficient documentation

## 2017-04-29 DIAGNOSIS — Z87891 Personal history of nicotine dependence: Secondary | ICD-10-CM | POA: Insufficient documentation

## 2017-04-29 DIAGNOSIS — Z95828 Presence of other vascular implants and grafts: Secondary | ICD-10-CM | POA: Insufficient documentation

## 2017-04-29 DIAGNOSIS — Z931 Gastrostomy status: Secondary | ICD-10-CM | POA: Insufficient documentation

## 2017-04-29 DIAGNOSIS — K922 Gastrointestinal hemorrhage, unspecified: Secondary | ICD-10-CM

## 2017-04-29 LAB — CBC
HEMATOCRIT: 22.3 % — AB (ref 39.0–52.0)
HEMOGLOBIN: 7.7 g/dL — AB (ref 13.0–17.0)
MCH: 30.2 pg (ref 26.0–34.0)
MCHC: 34.5 g/dL (ref 30.0–36.0)
MCV: 87.5 fL (ref 78.0–100.0)
Platelets: 261 10*3/uL (ref 150–400)
RBC: 2.55 MIL/uL — ABNORMAL LOW (ref 4.22–5.81)
RDW: 16.6 % — ABNORMAL HIGH (ref 11.5–15.5)
WBC: 9.6 10*3/uL (ref 4.0–10.5)

## 2017-04-29 LAB — COMPREHENSIVE METABOLIC PANEL
ALK PHOS: 59 U/L (ref 38–126)
ALT: 13 U/L — AB (ref 17–63)
ANION GAP: 11 (ref 5–15)
AST: 16 U/L (ref 15–41)
Albumin: 2 g/dL — ABNORMAL LOW (ref 3.5–5.0)
BUN: 14 mg/dL (ref 6–20)
CALCIUM: 7.5 mg/dL — AB (ref 8.9–10.3)
CO2: 24 mmol/L (ref 22–32)
CREATININE: 0.71 mg/dL (ref 0.61–1.24)
Chloride: 99 mmol/L — ABNORMAL LOW (ref 101–111)
Glucose, Bld: 175 mg/dL — ABNORMAL HIGH (ref 65–99)
Potassium: 4.1 mmol/L (ref 3.5–5.1)
Sodium: 134 mmol/L — ABNORMAL LOW (ref 135–145)
TOTAL PROTEIN: 5.2 g/dL — AB (ref 6.5–8.1)
Total Bilirubin: 0.3 mg/dL (ref 0.3–1.2)

## 2017-04-29 LAB — HEPATIC FUNCTION PANEL
ALBUMIN: 2.1 g/dL — AB (ref 3.5–5.0)
ALT: 14 U/L — ABNORMAL LOW (ref 17–63)
AST: 16 U/L (ref 15–41)
Alkaline Phosphatase: 63 U/L (ref 38–126)
BILIRUBIN TOTAL: 0.2 mg/dL — AB (ref 0.3–1.2)
Bilirubin, Direct: <0.1 mg/dL — ABNORMAL LOW (ref 0.1–0.5)
TOTAL PROTEIN: 5.4 g/dL — AB (ref 6.5–8.1)

## 2017-04-29 LAB — CBG MONITORING, ED: GLUCOSE-CAPILLARY: 215 mg/dL — AB (ref 65–99)

## 2017-04-29 LAB — PREPARE RBC (CROSSMATCH)

## 2017-04-29 LAB — I-STAT TROPONIN, ED: TROPONIN I, POC: 0 ng/mL (ref 0.00–0.08)

## 2017-04-29 LAB — I-STAT CG4 LACTIC ACID, ED: Lactic Acid, Venous: 1.43 mmol/L (ref 0.5–1.9)

## 2017-04-29 MED ORDER — SODIUM CHLORIDE 0.9 % IV BOLUS (SEPSIS)
500.0000 mL | Freq: Once | INTRAVENOUS | Status: AC
  Administered 2017-04-29: 500 mL via INTRAVENOUS

## 2017-04-29 MED ORDER — PANTOPRAZOLE SODIUM 40 MG IV SOLR
40.0000 mg | Freq: Once | INTRAVENOUS | Status: AC
  Administered 2017-04-29: 40 mg via INTRAVENOUS
  Filled 2017-04-29: qty 40

## 2017-04-29 MED ORDER — SODIUM CHLORIDE 0.9 % IV BOLUS (SEPSIS)
1000.0000 mL | Freq: Once | INTRAVENOUS | Status: AC
  Administered 2017-04-29: 1000 mL via INTRAVENOUS

## 2017-04-29 MED ORDER — NOREPINEPHRINE BITARTRATE 1 MG/ML IV SOLN
0.0000 ug/min | Freq: Once | INTRAVENOUS | Status: DC
  Filled 2017-04-29: qty 4

## 2017-04-29 MED ORDER — SODIUM CHLORIDE 0.9 % IV SOLN
10.0000 mL/h | Freq: Once | INTRAVENOUS | Status: AC
  Administered 2017-04-29: 10 mL/h via INTRAVENOUS

## 2017-04-29 MED ORDER — ETOMIDATE 2 MG/ML IV SOLN
INTRAVENOUS | Status: DC | PRN
  Administered 2017-04-29: 20 mg via INTRAVENOUS

## 2017-04-29 MED ORDER — SUCCINYLCHOLINE CHLORIDE 20 MG/ML IJ SOLN
INTRAMUSCULAR | Status: DC | PRN
  Administered 2017-04-29: 100 mg via INTRAVENOUS

## 2017-04-30 LAB — BPAM RBC
BLOOD PRODUCT EXPIRATION DATE: 201810252359
Blood Product Expiration Date: 201810302359
ISSUE DATE / TIME: 201810051330
UNIT TYPE AND RH: 9500
Unit Type and Rh: 9500

## 2017-04-30 LAB — TYPE AND SCREEN
ABO/RH(D): O NEG
Antibody Screen: NEGATIVE
UNIT DIVISION: 0
Unit division: 0

## 2017-05-02 ENCOUNTER — Ambulatory Visit: Payer: Medicare Other

## 2017-05-02 ENCOUNTER — Other Ambulatory Visit: Payer: Self-pay | Admitting: Nurse Practitioner

## 2017-05-02 NOTE — Telephone Encounter (Signed)
Will forward to MBD.  We will sign death certificate.

## 2017-05-03 ENCOUNTER — Ambulatory Visit: Payer: Self-pay | Admitting: Hematology

## 2017-05-03 ENCOUNTER — Ambulatory Visit: Payer: Medicare Other

## 2017-05-03 ENCOUNTER — Encounter: Payer: Self-pay | Admitting: Nutrition

## 2017-05-03 ENCOUNTER — Ambulatory Visit: Payer: Self-pay

## 2017-05-03 ENCOUNTER — Other Ambulatory Visit: Payer: Self-pay

## 2017-05-04 ENCOUNTER — Ambulatory Visit: Payer: Medicare Other

## 2017-05-04 ENCOUNTER — Encounter: Payer: Self-pay | Admitting: Radiation Oncology

## 2017-05-04 NOTE — Progress Notes (Signed)
  Radiation Oncology         [phone] ________________________________  Name: Kurt Fisher MRN: [id number]  Date: 05/04/2017  DOB: 12-May-1961  End of Treatment Note  Diagnosis:   Stage IVA (cT4b, cN2, cM0) adenocarcinoma of esophagus      Indication for treatment:  Curative       Radiation treatment dates:   03/22/2017 - 04/28/2017  Site/dose:   1. Esoph/ 1.8Gy X 25 fractions           2. Esoph_boost/ 1.8Gy X 3 fractions  Beams/energy:   1. IMRT - 6X        2. IMRT - 6X  Narrative: The patient did not tolerate radiation treatment well. Pt reported episodes of dizziness, increased fatigue, and decreased appetite during treatment. By the end of treatment his vitals were not stable. Patient's last day of treatment was 04/28/2017. He expired on May 21, 2017 in the ED.  Plan: No further follow-up.   ------------------------------------------------  Jodelle Gross, MD, PhD  This document serves as a record of services personally performed by Kyung Rudd, MD. It was created on his behalf by Margit Banda, a trained medical scribe. The creation of this record is based on the scribe's personal observations and the provider's statements to them. This document has been checked and approved by the attending provider.

## 2017-05-05 ENCOUNTER — Ambulatory Visit: Payer: Medicare Other

## 2017-05-06 ENCOUNTER — Ambulatory Visit: Payer: Medicare Other

## 2017-05-09 ENCOUNTER — Ambulatory Visit: Payer: Medicare Other

## 2017-05-10 ENCOUNTER — Ambulatory Visit: Payer: Medicare Other

## 2017-05-11 ENCOUNTER — Ambulatory Visit: Payer: Medicare Other

## 2017-05-26 NOTE — ED Notes (Signed)
Bag valve mask in process. [place], remains at bedside since blood reaction.

## 2017-05-26 NOTE — ED Notes (Signed)
Pt states he is unable to give a urine sample at this time.

## 2017-05-26 NOTE — ED Notes (Signed)
Family remains at bedside. Per [place], [facility] call when family ready to leave.

## 2017-05-26 NOTE — ED Notes (Addendum)
Kurt Mains, MD at bedside in process of intubation looking via glydoscope to confirm visual field. Brother calls confirming patient DNR status. Wife at bedside confirms. Comfort care initiated at this time. Respiratory continues to use bag-valve mask until sedation wears off.

## 2017-05-26 NOTE — ED Triage Notes (Signed)
Per EMS-hypotensive-states history of cancer-had to have transfusion recently-states patient stood up this am and became dizzy and passed out-wife called EMS and patient refused transport-they were called out again-has appointment at [facility] at 3 pm for fluids-systolic BP 92 HR [id number] orthostatics-has had 1000 cc of NS-4 mg of IV Zofran-sinus tach on monitor-nausea resolved

## 2017-05-26 NOTE — ED Provider Notes (Signed)
[place] DEPT Provider Note   CSN: [id number] Arrival date & time: 17-May-2017  1119     History   Chief Complaint Chief Complaint  Patient presents with  . Hypotension    HPI Kurt Fisher is a 56 y.o. male.  HPI Patient presents by EMS for syncope. Found unconscious by wife. Noted to be hypotensive and tachycardic en route. Given 1 L of IV fluids with improvement of blood pressure. Patient recently admitted for anemia due to GI bleed. Has esophageal cancer and this is thought to be the source of his bleeding. This is noticed blood in the G-tube recently. Does not believe that he has had. Denies headache. No neck pain. No focal weakness or numbness. Denies any abdominal pain. No chest pain or lower extremity swelling. Currently denies any lightheadedness or dizziness. Past Medical History:  Diagnosis Date  . Anxiety   . Bipolar 1 disorder ([place])   . Depression   . Esophageal cancer ([place]) 02/16/2017  . GERD (gastroesophageal reflux disease)     Patient Active Problem List   Diagnosis Date Noted  . Syncope 04/21/2017  . Acute blood loss anemia 04/21/2017  . Gastrointestinal hemorrhage   . Bipolar 1 disorder ([place]) 04/04/2017  . Iron deficiency anemia   . Malignant neoplasm of lower third of esophagus ([place]) 02/16/2017  . Loss of weight 01/27/2017  . Guaiac positive stools 01/27/2017  . Gastroesophageal reflux disease without esophagitis 01/27/2017  . GERD (gastroesophageal reflux disease) 01/22/2016  . Anxiety     Past Surgical History:  Procedure Laterality Date  . BIOPSY  02/09/2017   Procedure: BIOPSY;  Surgeon: Rogene Houston, MD;  Location: [facility];  Service: Endoscopy;;  gastric/GE Junction/distal esophagus bx   . COLONOSCOPY N/A 02/09/2017   Procedure: COLONOSCOPY;  Surgeon: Rogene Houston, MD;  Location: [facility];  Service: Endoscopy;  Laterality: N/A;  . ESOPHAGOGASTRODUODENOSCOPY N/A 02/09/2017   Procedure: ESOPHAGOGASTRODUODENOSCOPY (EGD);   Surgeon: Rogene Houston, MD;  Location: [facility];  Service: Endoscopy;  Laterality: N/A;  2:40-moved up to 1255 per Lelon Frohlich  . IR FLUORO GUIDE PORT INSERTION RIGHT  03/07/2017  . IR GASTROSTOMY TUBE MOD SED  03/07/2017  . IR PATIENT EVAL TECH 0-60 MINS  03/18/2017  . IR US GUIDE VASC ACCESS RIGHT  03/07/2017       Home Medications    Prior to Admission medications   Medication Sig Start Date End Date Taking? Authorizing Provider  bismuth subsalicylate (PEPTO BISMOL) 262 MG/15ML suspension Take 30 mLs by mouth every 6 (six) hours as needed for indigestion or diarrhea or loose stools.   Yes [provider]  dexamethasone (DECADRON) 4 MG tablet Take 1 tablet (4 mg total) by mouth 2 (two) times daily with a meal. 03/30/17  Yes Truitt Merle, MD  emollient (BIAFINE) cream Apply 1 application topically 2 (two) times daily. Apply to chest area after rad tx daily and prn,nothing 4 hours prior to rad txs, 03/24/17  Yes Hayden Pedro, PA-C  ENSURE PLUS (ENSURE PLUS) LIQD Take 237 mLs by mouth 3 (three) times daily between meals.   Yes [provider]  HYDROcodone-acetaminophen (HYCET) 7.5-325 mg/15 ml solution Take 15 mLs by mouth every 6 (six) hours as needed for moderate pain. 04/11/17  Yes Truitt Merle, MD  lidocaine-prilocaine (EMLA) cream Apply 1 application topically as needed. Apply to portacath  1 1/2 hours to 2 hours prior to procedures as needed. 03/22/17  Yes Truitt Merle, MD  Nutritional  Supplements (FEEDING SUPPLEMENT, OSMOLITE 1.5 CAL,) LIQD Give 1 1/2 cans of tube feeding via feeding tube 4 times per day.  Flush tube with 23ml of water before and after feeding.   Start with 1/2 can of tube feeding 4 times per day and increase by 1/2 can daily until goal rate of 6 cans per day reached. Send bolus tube feeding supplies. 03/04/17  Yes Holley Bouche, NP  ondansetron (ZOFRAN) 4 MG tablet TAKE 2 TABLETS BY MOUTH EVERY 8 HOURS AS NEEDED FOR NAUSEA OR VOMITING 04/12/17  Yes Truitt Merle, MD  pantoprazole (PROTONIX) 40 MG tablet Take 1 tablet (40 mg total) by mouth 2 (two) times daily before a meal. 01/19/17  Yes Setzer, Terri L, NP  PARoxetine (PAXIL) 40 MG tablet Take 40 mg by mouth every evening.    Yes [provider]  prochlorperazine (COMPAZINE) 10 MG tablet TAKE 1 TABLET BY MOUTH EVERY 6 HOURS AS NEEDED FOR NAUSEA OR VOMITING 04/12/17  Yes Truitt Merle, MD  QUEtiapine (SEROQUEL) 300 MG tablet Take 600 mg by mouth at bedtime.    Yes [provider]    Family History Family History  Problem Relation Age of Onset  . Asthma Daughter   . Cancer Mother 43       lung  . Cancer Father   . Diabetes Father   . Diabetes Brother   . Heart attack Brother   . Cancer Paternal Grandmother   . Asthma Daughter   . Fibromyalgia Daughter   . Migraines Daughter   . Anxiety disorder Daughter   . Mood Disorder Daughter   . Panic disorder Daughter   . OCD Daughter     Social History Social History  Substance Use Topics  . Smoking status: Former Smoker    Types: Cigarettes    Quit date: 02/17/2003  . Smokeless tobacco: Never Used     Comment: quit 14 yrs ago  . Alcohol use No     Allergies   Taxol [paclitaxel]   Review of Systems Review of Systems  Constitutional: Negative for chills and fever.  HENT: Negative for facial swelling.   Respiratory: Negative for cough and shortness of breath.   Cardiovascular: Negative for chest pain and leg swelling.  Gastrointestinal: Positive for blood in stool. Negative for abdominal pain, diarrhea, nausea and vomiting.  Genitourinary: Negative for dysuria, flank pain and frequency.  Musculoskeletal: Negative for back pain, myalgias, neck pain and neck stiffness.  Skin: Negative for rash and wound.  Neurological: Positive for dizziness, syncope and light-headedness. Negative for weakness, numbness and headaches.  All other systems reviewed and are negative.    Physical Exam Updated Vital Signs BP (!) 79/69    Pulse (!) 0   Temp (!) 97.5 F (36.4 C) (Oral)   Resp 12   SpO2 90%   Physical Exam  Constitutional: He is oriented to person, place, and time. He appears well-developed. No distress.  HENT:  Head: Normocephalic and atraumatic.  Mouth/Throat: Oropharynx is clear and moist. No oropharyngeal exudate.  No obvious scalp trauma  Eyes: Pupils are equal, round, and reactive to light. EOM are normal.  Neck: Normal range of motion. Neck supple.  No posterior midline cervical tenderness to palpation.  Cardiovascular: Regular rhythm.   Tachycardia  Pulmonary/Chest: Effort normal and breath sounds normal. No respiratory distress. He has no wheezes. He has no rales. He exhibits no tenderness.  Abdominal: Soft. Bowel sounds are normal. There is no tenderness. There is no rebound  and no guarding.  G-tube in place with a small amount of dark blood in the tubing.  Musculoskeletal: Normal range of motion. He exhibits no edema or tenderness.  No lower extremity swelling, asymmetry or tenderness. Distal pulses are 2+.  Neurological: He is alert and oriented to person, place, and time.  Sensation to light touch grossly intact. 5/5 Motor in all extremities. S  Skin: Skin is warm and dry. No rash noted. No erythema.  Psychiatric: He has a normal mood and affect. His behavior is normal.  Nursing note and vitals reviewed.    ED Treatments / Results  Labs (all labs ordered are listed, but only abnormal results are displayed) Labs Reviewed  CBC - Abnormal; Notable for the following:       Result Value   RBC 2.55 (*)    Hemoglobin 7.7 (*)    HCT 22.3 (*)    RDW 16.6 (*)    All other components within normal limits  HEPATIC FUNCTION PANEL - Abnormal; Notable for the following:    Total Protein 5.4 (*)    Albumin 2.1 (*)    ALT 14 (*)    Total Bilirubin 0.2 (*)    Bilirubin, Direct <0.1 (*)    All other components within normal limits  CBG MONITORING, ED - Abnormal; Notable for the following:     Glucose-Capillary 215 (*)    All other components within normal limits  URINALYSIS, ROUTINE W REFLEX MICROSCOPIC  COMPREHENSIVE METABOLIC PANEL  I-STAT TROPONIN, ED  I-STAT CG4 LACTIC ACID, ED  TYPE AND SCREEN  PREPARE RBC (CROSSMATCH)    EKG  EKG Interpretation  Date/Time:  2017-05-11 14:25:02 EDT Ventricular Rate:  164 PR Interval:    QRS Duration: 93 QT Interval:  286 QTC Calculation: 473 R Axis:   92 Text Interpretation:  Sinus or ectopic atrial tachycardia Ventricular premature complex Consider right atrial enlargement Borderline right axis deviation Confirmed by Julianne Rice [phone]) on 05-11-2017 3:37:04 PM       Radiology Dg Chest Port 1 View  Result Date: 2017/05/11 CLINICAL DATA:  Shortness of Breath, history of esophageal carcinoma EXAM: PORTABLE CHEST 1 VIEW COMPARISON:  02/14/2017 FINDINGS: Cardiac shadow is within normal limits. Right chest wall port is noted in satisfactory position. The lungs are well aerated bilaterally. No acute bony abnormality is seen. IMPRESSION: No acute abnormality noted. Electronically Signed   By: Inez Catalina M.D.   On: 05-11-2017 14:50    Procedures Procedures (including critical care time)  Medications Ordered in ED Medications  etomidate (AMIDATE) injection (20 mg Intravenous Given 05-11-17 1447)  succinylcholine (ANECTINE) injection (100 mg Intravenous Given 05/11/17 1447)  sodium chloride 0.9 % bolus 500 mL (0 mLs Intravenous Stopped 05/11/2017 1219)  sodium chloride 0.9 % bolus 500 mL (0 mLs Intravenous Stopped May 11, 2017 1251)  0.9 %  sodium chloride infusion (0 mL/hr Intravenous Stopped 05/11/2017 1504)  pantoprazole (PROTONIX) injection 40 mg (40 mg Intravenous Given May 11, 2017 1322)  sodium chloride 0.9 % bolus 1,000 mL (0 mLs Intravenous Stopped 05-11-17 1521)  sodium chloride 0.9 % bolus 1,000 mL (0 mLs Intravenous Stopped 11-May-2017 1521)    CRITICAL CARE Performed by: Lita Mains, Will Heinkel Total critical care time: 55  minutes Critical care time was exclusive of separately billable procedures and treating other patients. Critical care was necessary to treat or prevent imminent or life-threatening deterioration. Critical care was time spent personally by me on the following activities: development of treatment plan with patient and/or surrogate as  well as nursing, discussions with consultants, evaluation of patient's response to treatment, examination of patient, obtaining history from patient or surrogate, ordering and performing treatments and interventions, ordering and review of laboratory studies, ordering and review of radiographic studies, pulse oximetry and re-evaluation of patient's condition. Initial Impression / Assessment and Plan / ED Course  I have reviewed the triage vital signs and the nursing notes.  Pertinent labs & imaging results that were available during my care of the patient were reviewed by me and considered in my medical decision making (see chart for details).     Patient has history of metastatic esophageal cancer stage IVA. Patient became acutely short of breath, tachycardic into the 160s and hypotensive. Began coughing up blood clots. Chest x-ray without acute findings. Hypoxia despite nonrebreather. Discussed with ex-wife who is at the patient's bedside. Patient does not have a end-of-life care plan. Ex-wife states that he has says in the past that he does not want any further treatment if it is not curative. Discussed on the phone with the patient's brother. Closest adult relative. Decision made to make the patient DNR and not to intubate patient. Will proceed with comfort care.  Became bradycardic and asystolic. Time of death 84. Discussed with Dr. Neomia Dear office and will sign death certificate. Final Clinical Impressions(s) / ED Diagnoses   Final diagnoses:  Upper gastrointestinal bleed  Acute respiratory failure with hypoxia [facility])    New Prescriptions New Prescriptions     No medications on file     Julianne Rice, MD 05/22/17 1537

## 2017-05-26 NOTE — ED Notes (Signed)
Pt moaning and diaphoretic. Nonrebreather placed. Portable DG at bedside.

## 2017-05-26 NOTE — ED Notes (Signed)
RN to access port and obtain labs

## 2017-05-26 NOTE — ED Notes (Signed)
Respiratory and [place] MD at bedside.

## 2017-05-26 NOTE — ED Notes (Signed)
Pt's CBG=214

## 2017-05-26 NOTE — ED Notes (Addendum)
Pt acute onset SOB, agitated, coughing up blood. Blood stopped. NS fluid bolus started. Lita Mains, MD at bedside.

## 2017-05-26 NOTE — ED Notes (Signed)
Pharmacy called for STAT send of levofed

## 2017-05-26 NOTE — Telephone Encounter (Signed)
Received call from Dr. Julianne Rice with ED. [phone]~ telephone.   Patient presented via EMS with episode of syncope. Hx of metastatic esophageal cancer. Began coughing up blood clots. Family aware and DNR put in place. TOD 1521.  Requested to have PCP sign death certificate.

## 2017-05-26 NOTE — ED Notes (Signed)
Protonix given per pt request for nausea. Order placed by Lita Mains, MD.

## 2017-05-26 NOTE — Telephone Encounter (Signed)
Received call from pt's ex-wife, Watt Climes stating that she found pt passed out at home & she called 911 & they are transporting to the ED.  She reports that he had to have 3 units of PRBC's last W/E she noted some blood in his G-tube.  Message to Dr Burr Medico.

## 2017-05-26 NOTE — ED Notes (Signed)
Bed: WA14 Expected date:  Expected time:  Means of arrival:  Comments: 

## 2017-05-26 DEATH — deceased

## 2018-03-05 IMAGING — CT NM PET TUM IMG INITIAL (PI) SKULL BASE T - THIGH
7 series · 25 of 25 positions shown · non-contrast
Comparison: Multiple exams, including CT scans from 02/14/2017 and
01/29/2017

CLINICAL DATA: Initial treatment strategy for esophageal cancer.

EXAM:
NUCLEAR MEDICINE PET SKULL BASE TO THIGH
TECHNIQUE: 7.3 mCi F-18 FDG was injected intravenously. Full-ring PET imaging
was performed from the skull base to thigh after the radiotracer. CT
data was obtained and used for attenuation correction and anatomic
localization.
FASTING BLOOD GLUCOSE:  Value: 103 mg/dl

[Series 3: pet sk_thigh ac · axial · 5.0mm · 4.07mm/px · z∈[-372,+596]mm · 6 of 243 slices shown]
[im 1/243]
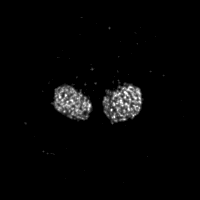
[im 49/243]
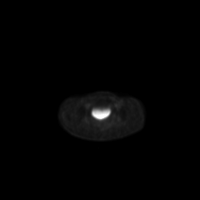
[im 97/243]
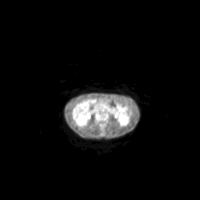
[im 146/243]
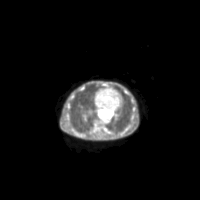
[im 194/243]
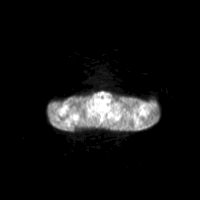
[im 243/243]
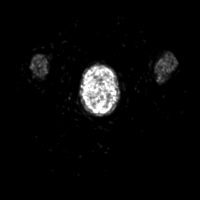

[Series 4: ct sk_thigh 5.0 b31f · axial · 5.0mm · 0.92mm/px · z∈[-372,+596]mm · 5 of 243 slices shown]
[im 1/243]
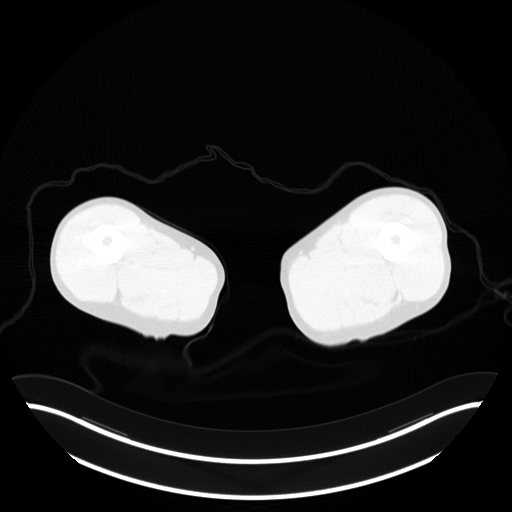
[im 61/243]
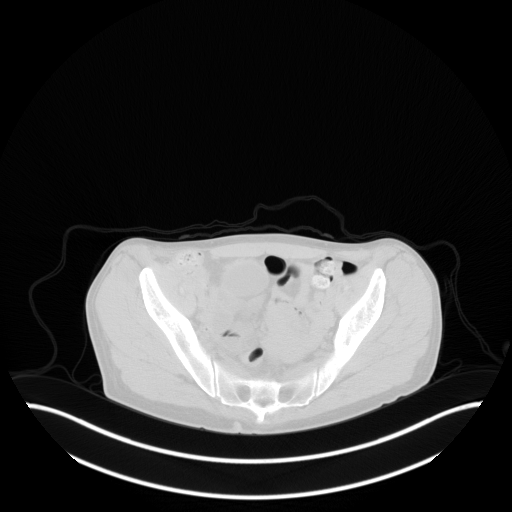
[im 122/243]
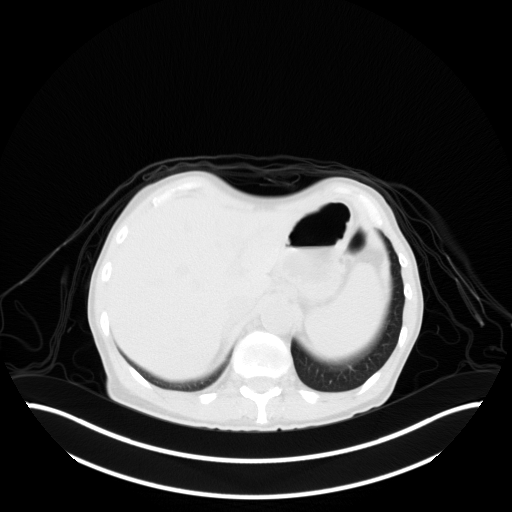
[im 182/243]
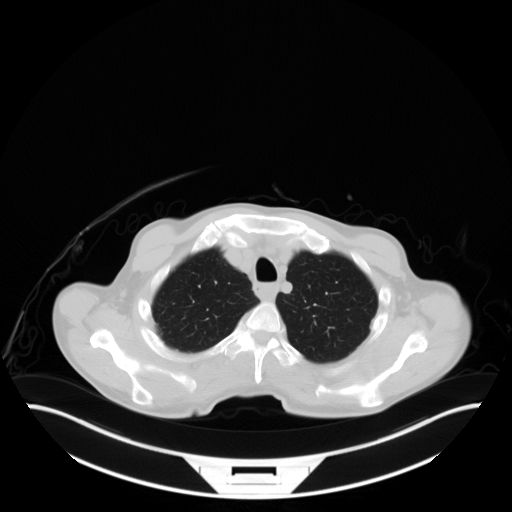
[im 243/243  brain]
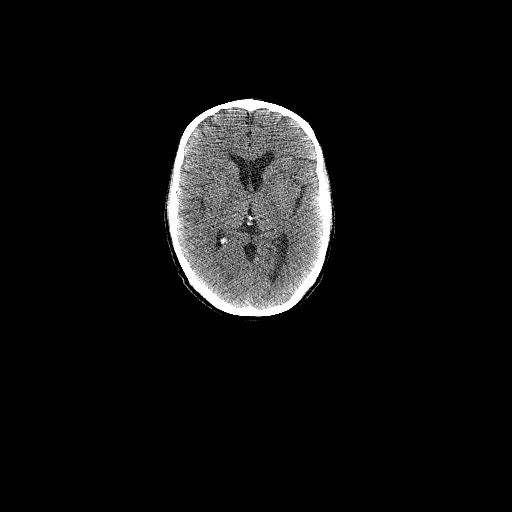

[Series 7: ct sk_thigh 5.0 b70f lung_bone · axial · 5.0mm · 0.65mm/px · z∈[+86,+418]mm · 2 of 84 slices shown]
[im 1/84  bone]
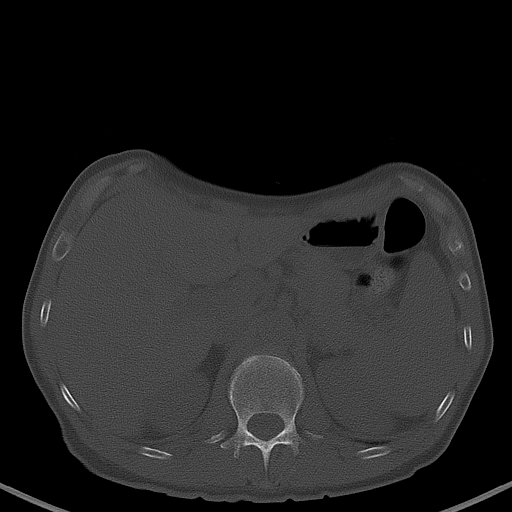
[im 84/84  bone]
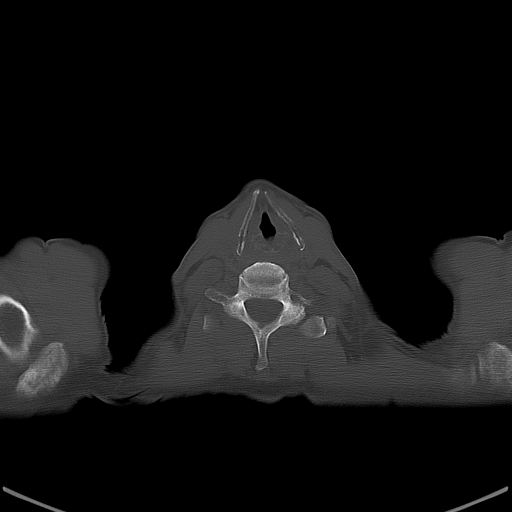

[Series 8: pet sk_thigh nac · axial · 5.0mm · 4.07mm/px · z∈[-372,+596]mm · 5 of 243 slices shown]
[im 1/243]
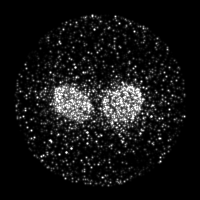
[im 61/243]
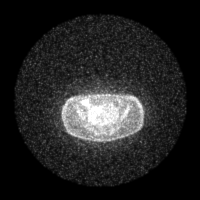
[im 122/243]
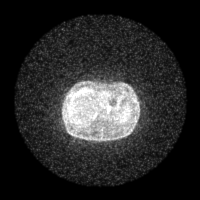
[im 182/243]
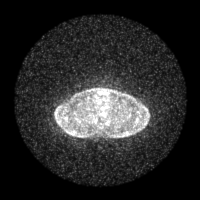
[im 243/243]
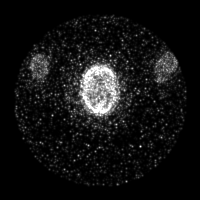

[Series 604: range-ct sk_thigh 5.0 (id)<alpha range> · 1 of 58 slices shown (1 of 2)]
[im 1/58]
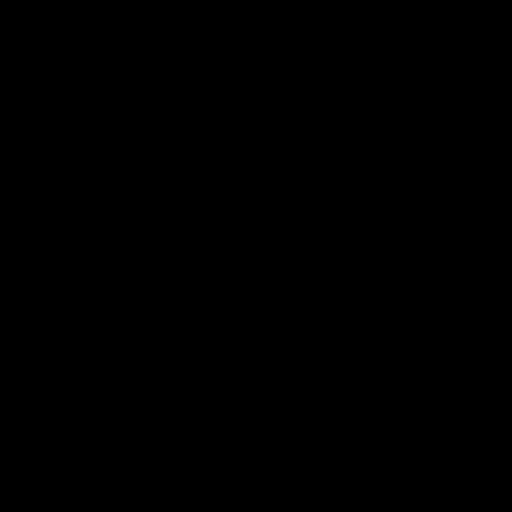

[Series 605: mip collection · coronal · 2.01mm/px · 1 of 32 slices shown]
[im 1/32]
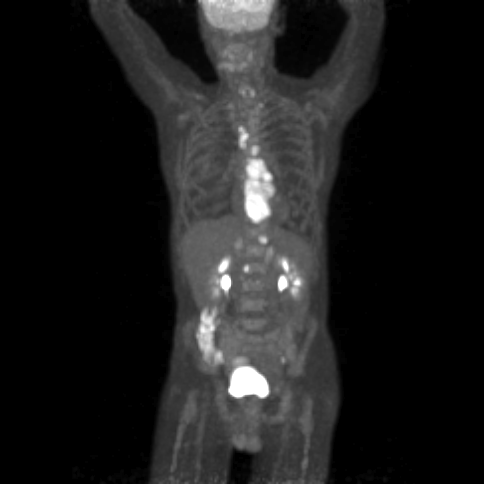

[Series 606: range-ct sk_thigh 5.0 (id)<alpha range> · 5 of 229 slices shown (2 of 2)]
[im 1/229]
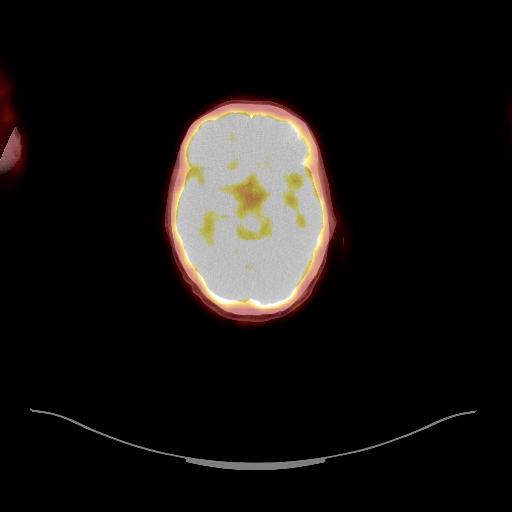
[im 58/229]
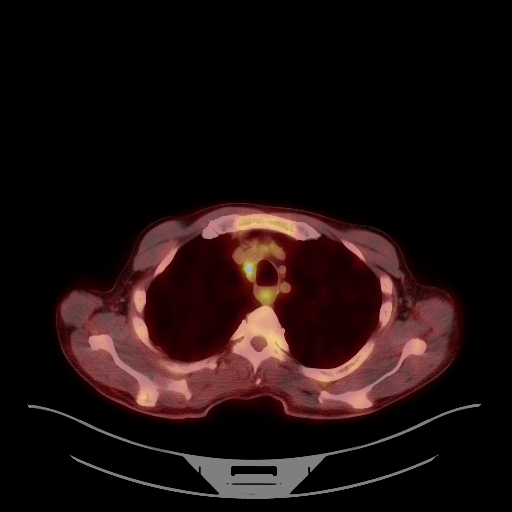
[im 115/229]
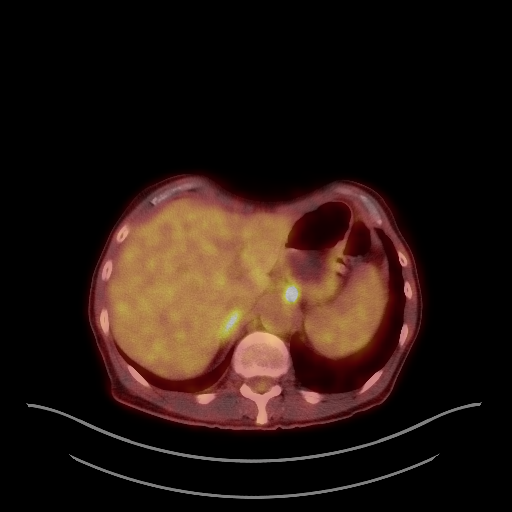
[im 172/229]
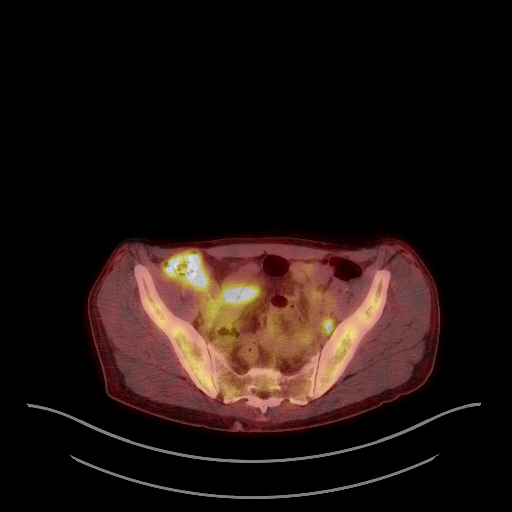
[im 229/229]
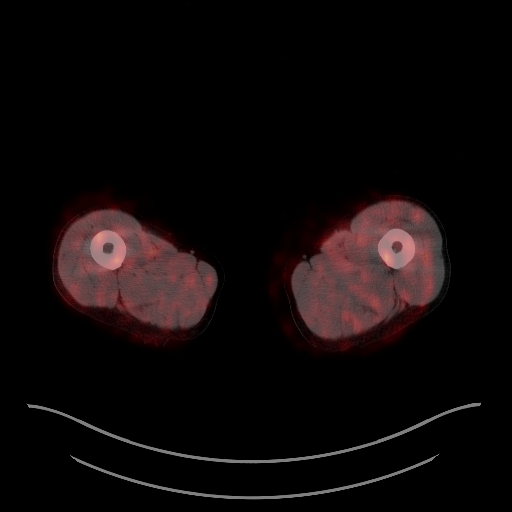

[25 of 25 positions shown; findings below may reference images not displayed]

FINDINGS: NECK

No hypermetabolic lymph nodes in the neck.

CHEST

The large esophageal mass extends down from about the level of the
carina to the gastroesophageal junction in the vicinity of a small
type 1 hiatal hernia. Maximum SUV in this vicinity is 28.4. This
measurement does include the multiple hypermetabolic paraesophageal
lymph nodes which are not readily separable from the esophagus
except in a few individual cases. These paraesophageal lymph nodes
are primarily in the vicinity of the esophageal mass although extend
into the subcarinal region, around greater than 180 degrees of the
left mainstem bronchus, in the right paratracheal region, and in the
left and perhaps right supraclavicular fossa. Index right
paratracheal node measures 1.1 cm in short axis on image 67/4 and
has a maximum SUV of 16.8. An upper left paraesophageal lymph node
above the level of the tumor measures 1.3 cm in short axis on image
75/4 with maximum SUV 11.2. A rim calcified subcarinal lymph node is
noted but is not overtly hypermetabolic. The right hilar and left
infrahilar lymph nodes shown on the prior CT chest are not
significantly hypermetabolic.

Mild biapical pleuroparenchymal scarring in the lungs.

ABDOMEN/PELVIS

A node adjacent to the gastric cardia (anulus lymphaticus cardiae)
measures 1.4 cm in short axis on image 121/4, with maximum SUV 12.8.
A right periaortic lymph node at the level of the renal vein
measures 1.0 cm in short axis on image 135/4, with maximum SUV 13.7.
Several additional small periaortic lymph nodes are present.

Faintly hypermetabolic activity of both adrenal glands noted without
overt adrenal mass seen. Left adrenal, and maximum SUV 6.1, right
adrenal gland maximum SUV 5.2.

Activity in the left hemipelvis is thought to be in the ureter.

Accentuated activity in the cecum and proximal ascending colon,
thought to be physiologic.

Aortoiliac atherosclerotic vascular disease. Median lobe of the
prostate gland indents the bladder base.

SKELETON

No focal hypermetabolic activity to suggest skeletal metastasis.
IMPRESSION: 1. Large mass in the distal half of the thoracic esophagus, maximum
SUV 28.4, compatible primary malignancy. Multiple associated
metastatic lymph nodes in the mediastinum and below the diaphragm
adjacent to the gastric cardia and in the retroperitoneum.
2. Faintly accentuated activity in both adrenal glands without
visible mass, this activity could be physiologic but merits
surveillance.
3.  Aortic Atherosclerosis (LDMOM-KB2.2).

## 2018-03-08 IMAGING — DX DG ABDOMEN 1V
1 series · 1 of 1 positions shown · non-contrast
Comparison: None.

CLINICAL DATA: Esophageal neoplasm.  Concern for barium in stomach

EXAM:
ABDOMEN - 1 VIEW

[abdomen kub]
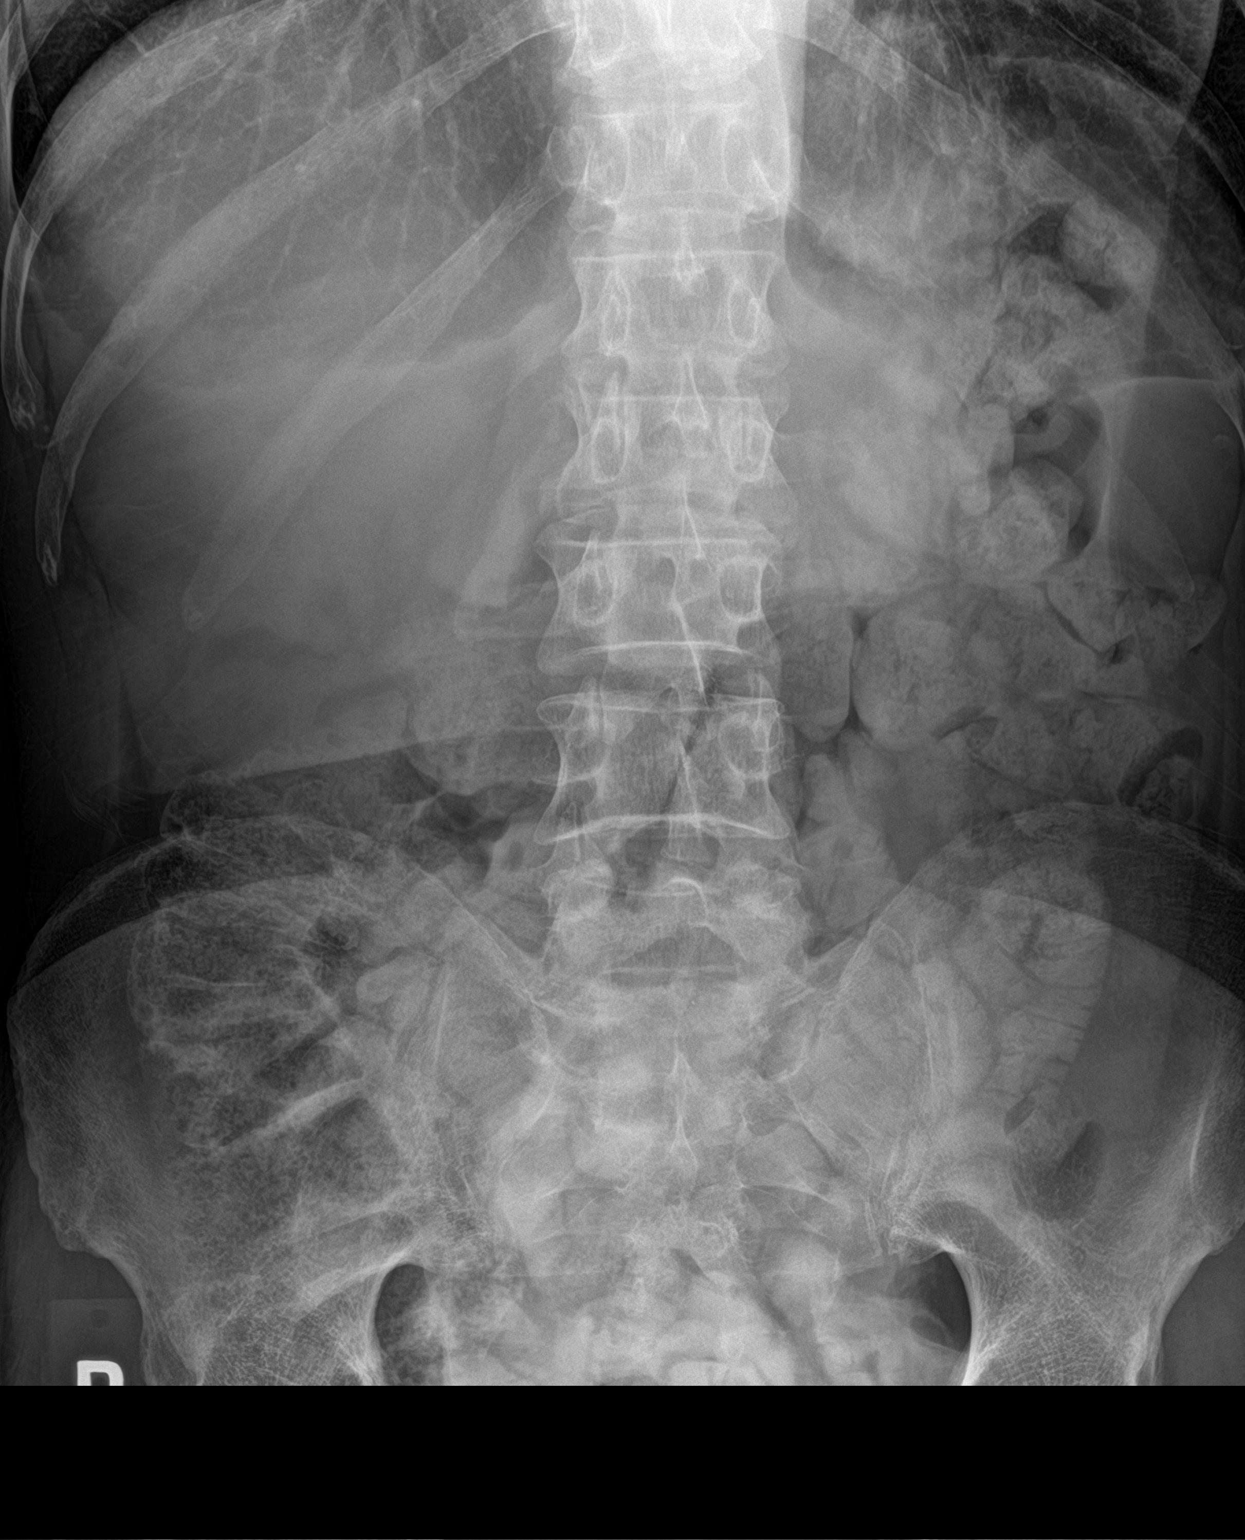

[1 of 1 positions shown; findings below may reference images not displayed]

FINDINGS: There is a small amount of contrast in the distal small bowel region
in the pelvis. There is no contrast seen in the region of the
stomach.

There is diffuse stool throughout the colon. There is no bowel
dilatation or air-fluid level to suggest bowel obstruction. No free
air. No abnormal calcifications are evident.
IMPRESSION: Small amount of contrast in distal loops of small bowel in the
pelvis. No gastric contrast evident.

Diffuse stool in colon.  No bowel obstruction or free air evident.

## 2018-05-04 IMAGING — DX DG CHEST 1V PORT
1 series · 1 of 1 positions shown · non-contrast
Comparison: 02/14/2017

CLINICAL DATA: Shortness of Breath, history of esophageal carcinoma

EXAM:
PORTABLE CHEST 1 VIEW

[chest ap]
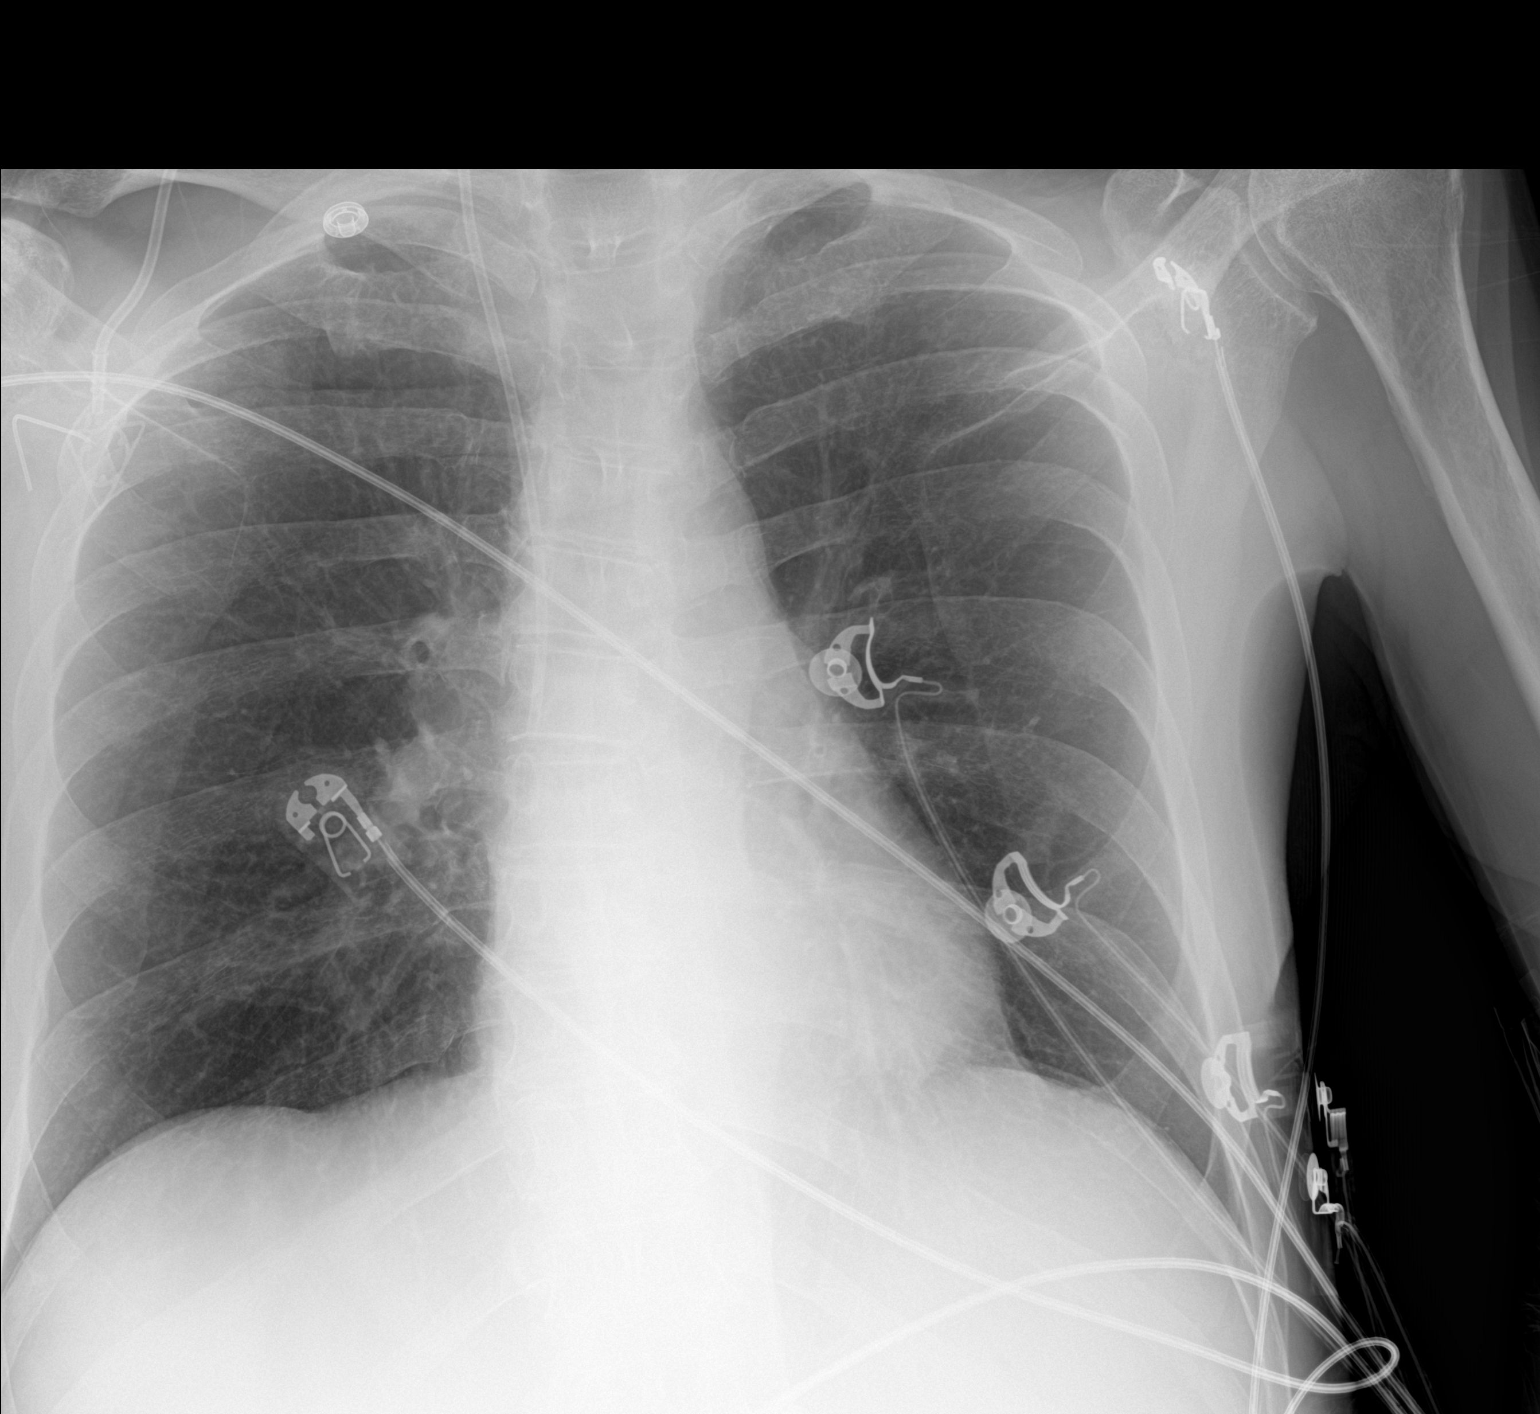

[1 of 1 positions shown; findings below may reference images not displayed]

FINDINGS: Cardiac shadow is within normal limits. Right chest wall port is
noted in satisfactory position. The lungs are well aerated
bilaterally. No acute bony abnormality is seen.
IMPRESSION: No acute abnormality noted.
